# Patient Record
Sex: Male | Born: 1965
Health system: Southern US, Community
[De-identification: ages and names within clinical notes are randomized; demographics above are authoritative.]

## PROBLEM LIST (undated history)

## (undated) ENCOUNTER — Inpatient Hospital Stay: Admission: AD | Payer: BLUE CROSS/BLUE SHIELD | Source: Ambulatory Visit | Admitting: Surgery

## (undated) DIAGNOSIS — L97309 Non-pressure chronic ulcer of unspecified ankle with unspecified severity: Secondary | ICD-10-CM

## (undated) DIAGNOSIS — I8390 Asymptomatic varicose veins of unspecified lower extremity: Secondary | ICD-10-CM

## (undated) DIAGNOSIS — I82409 Acute embolism and thrombosis of unspecified deep veins of unspecified lower extremity: Secondary | ICD-10-CM

## (undated) DIAGNOSIS — G40409 Other generalized epilepsy and epileptic syndromes, not intractable, without status epilepticus: Secondary | ICD-10-CM

## (undated) HISTORY — DX: Asymptomatic varicose veins of unspecified lower extremity: I83.90

## (undated) HISTORY — PX: FRACTURE SURGERY: SHX138

---

## 2007-04-28 ENCOUNTER — Ambulatory Visit: Payer: Self-pay | Admitting: Vascular Surgery

## 2007-08-08 ENCOUNTER — Ambulatory Visit: Payer: Self-pay | Admitting: Vascular Surgery

## 2007-09-10 ENCOUNTER — Ambulatory Visit: Payer: Self-pay | Admitting: Vascular Surgery

## 2007-09-17 ENCOUNTER — Ambulatory Visit: Payer: Self-pay | Admitting: Vascular Surgery

## 2008-01-21 ENCOUNTER — Ambulatory Visit: Payer: Self-pay | Admitting: Vascular Surgery

## 2008-01-28 ENCOUNTER — Ambulatory Visit: Payer: Self-pay | Admitting: Vascular Surgery

## 2008-02-23 ENCOUNTER — Emergency Department (HOSPITAL_COMMUNITY): Admission: EM | Admit: 2008-02-23 | Discharge: 2008-02-23 | Payer: Self-pay | Admitting: Emergency Medicine

## 2008-03-12 ENCOUNTER — Ambulatory Visit: Payer: Self-pay | Admitting: Vascular Surgery

## 2008-11-05 ENCOUNTER — Ambulatory Visit: Payer: Self-pay | Admitting: Vascular Surgery

## 2008-11-12 ENCOUNTER — Ambulatory Visit: Payer: Self-pay | Admitting: Vascular Surgery

## 2008-11-17 ENCOUNTER — Ambulatory Visit: Payer: Self-pay | Admitting: Vascular Surgery

## 2008-11-26 ENCOUNTER — Ambulatory Visit: Payer: Self-pay | Admitting: Vascular Surgery

## 2008-12-03 ENCOUNTER — Ambulatory Visit: Payer: Self-pay | Admitting: Vascular Surgery

## 2008-12-15 ENCOUNTER — Ambulatory Visit: Payer: Self-pay | Admitting: Vascular Surgery

## 2008-12-22 ENCOUNTER — Ambulatory Visit: Payer: Self-pay | Admitting: Vascular Surgery

## 2008-12-31 ENCOUNTER — Ambulatory Visit: Payer: Self-pay | Admitting: Vascular Surgery

## 2009-01-07 ENCOUNTER — Ambulatory Visit: Payer: Self-pay | Admitting: Vascular Surgery

## 2009-02-04 ENCOUNTER — Ambulatory Visit: Payer: Self-pay | Admitting: Vascular Surgery

## 2009-10-19 ENCOUNTER — Ambulatory Visit: Payer: Self-pay | Admitting: Vascular Surgery

## 2009-12-30 ENCOUNTER — Ambulatory Visit: Payer: Self-pay | Admitting: Vascular Surgery

## 2010-01-05 ENCOUNTER — Ambulatory Visit: Payer: Self-pay | Admitting: Vascular Surgery

## 2010-01-12 ENCOUNTER — Ambulatory Visit: Payer: Self-pay | Admitting: Vascular Surgery

## 2010-01-19 ENCOUNTER — Ambulatory Visit: Payer: Self-pay | Admitting: Vascular Surgery

## 2010-01-25 ENCOUNTER — Ambulatory Visit: Payer: Self-pay | Admitting: Vascular Surgery

## 2010-10-25 ENCOUNTER — Ambulatory Visit: Payer: Self-pay | Admitting: Vascular Surgery

## 2010-11-21 ENCOUNTER — Ambulatory Visit: Payer: Self-pay | Admitting: Vascular Surgery

## 2011-05-08 NOTE — Assessment & Plan Note (Signed)
OFFICE VISIT   TREVAUGHN, SCHEAR  DOB:  04/19/1966                                       11/05/2008  XBJYN#:[id number]   Patient presents today for evaluation of his lower extremity  ulcerations.  He is status post staged bilateral laser ablation and stab  phlebectomy and greater saphenous vein tributary varicosities.  The  right leg was in 08/2007 and left leg in 12/2007.  He reports having  superficial skin irritations and ulceration last week and was seen by  Dr. Ivory Broad, who appropriately placed him in [facility] boot therapy and started  him on Keflex 500 mg t.i.d.   He has had the Unna boot on for 2 days now.  He has not had any drainage  from this.  He did undergo a hand-held duplex by me, and this shows  closure of the saphenous vein bilaterally.  He does have some tributary  varicosities.   I have recommended that we continue with the Unna boot treatment.  We  will see him again in 1 week for continued Unna boot, and once this is  completely healed, we will repeat his formal duplex bilaterally to  determined if there is an etiology that can be corrected for ongoing  venous hypertension.  He reports this is not similar to his prior open  severe ulcerations.  We will continue to follow him with Unna boot and  eventual ultrasound treatment.  He is traveling to [facility] next week,  and I feel that this is appropriate with the usual precautions of up  walking while on the airplane.   Larina Earthly, M.D.  Electronically Signed   TFE/MEDQ  D:  11/05/2008  T:  11/08/2008  Job:  [id number]   cc:   Dr. Nolon Nations

## 2011-05-08 NOTE — Assessment & Plan Note (Signed)
OFFICE VISIT   Patrick Ingram, Patrick Ingram  DOB:  1966-02-27                                       03/12/2008  ZOXWR#:[id number]   The patient presents today for followup of his bilateral laser ablation  and stab phlebectomy.  He had a recent fall in the snow several weeks  ago and had some abrasion over his pretibial area on the left, and also  his hand.  He does continue to have some swelling.  His duplex today  shows that he does have closure of his greater saphenous vein  bilaterally with some tributary varices in his thighs bilaterally.  He  will continue his graduated compression use and evaluation when  possible.  He will see [place] on a p.r.n. basis.   Larina Earthly, M.D.  Electronically Signed   TFE/MEDQ  D:  03/12/2008  T:  03/15/2008  Job:  [id number]

## 2011-05-08 NOTE — Assessment & Plan Note (Signed)
OFFICE VISIT   Patrick Ingram, Patrick Ingram  DOB:  [birth year]                                       02/04/2009  EAVWU#:[id number]   The patient presents today for follow-up of his bilateral venous  hypertension.  He is status post staged bilateral laser ablation for  saphenous vein incompetence.  He also has deep venous incompetence and  has recurrent episodes of venous ulcers.  He has had effective treatment  with Unna boots and has now, fortunately, completely healed all  ulcerations.  He is extremely compliant with his compression garments.  He will notify us should he develop any difficulty and understands the  importance for lifelong compression.   Larina Earthly, M.D.  Electronically Signed   TFE/MEDQ  D:  02/04/2009  T:  02/08/2009  Job:  [id number]

## 2011-05-08 NOTE — Assessment & Plan Note (Signed)
OFFICE VISIT   Patrick Ingram, Patrick Ingram  DOB:  1966/10/12                                       08/08/2007  ZOXWR#:[id number]   Patrick Ingram presents today for continued followup of his severe venous  insufficiency bilaterally, this is more significant on the right than on  the left.  He reports that he has had no significant relief with the  stockings.  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  His leg pain makes this difficult with prolonged standing and  leg swelling.  He has had prior venous ulcers, these are currently  healed and he reports significant pain with these when they occur as  well.  He also has had a recent episode of superficial thrombophlebitis  which appears to be resolving without complication in his right medial  distal calf.  We again discussed options.  He has clearly failed  conservative therapy for his venous hypertension.  He has marked  saphenous and tributary varicosities.  I have recommended that we  proceed with right leg laser ablation and stab phlebectomy.  He does  have some low to moderate deep insufficiency as well and I explained  that this should markedly improve but he may continue to have some  venous hypertension long term.  He understands this and wishes to  proceed with laser ablation and stab phlebectomy of his right leg.  Once  he has recovered from this we will discuss need for left leg similar  treatment.   Larina Earthly, M.D.  Electronically Signed   TFE/MEDQ  D:  08/08/2007  T:  08/11/2007  Job:  [id number]

## 2011-05-08 NOTE — Assessment & Plan Note (Signed)
OFFICE VISIT   Patrick Ingram, Patrick Ingram  DOB:  1966-09-03                                       01/07/2009  EAVWU#:[id number]   The patient presents today for continued followup of his lower extremity  venous pathology.  He is status post bilateral laser ablations of the  saphenous vein.  He does have known deep venous incompetence as well.  He had developed recurrent ulceration on his left ankle and medial  malleolus area.  This has continued to heal with weekly Unna boot  therapy.  He has no evidence of ulceration today and will be switched to  compression garments.  He did have a repeat duplex today, and this shows  closure of the saphenous vein.  He will see me again in 1 month taken to  assure complete healing.   Larina Earthly, M.D.  Electronically Signed   TFE/MEDQ  D:  01/07/2009  T:  01/10/2009  Job:  [id number]

## 2011-05-08 NOTE — Assessment & Plan Note (Signed)
OFFICE VISIT   Patrick Ingram, Patrick Ingram  DOB:  Apr 29, 1966                                       01/25/2010  AOZHY#:[id number]   The patient presents today for followup of his venous hypertension.  He  had had recurrent ulceration in his left ankle.    DICTATION ENDS AT THIS POINT     Larina Earthly, M.D.  Electronically Signed   TFE/MEDQ  D:  01/25/2010  T:  01/26/2010  Job:  [id number]

## 2011-05-08 NOTE — Procedures (Signed)
DUPLEX DEEP VENOUS EXAM - LOWER EXTREMITY   INDICATION:  Follow up left greater saphenous vein ablation with  nonhealing ulcer of the left ankle.   HISTORY:  Edema:  Yes  Trauma/Surgery:  Yes  Pain:  Yes  PE:  No  Previous DVT:  No   DUPLEX EXAM:                CFV   SFV   PopV  PTV    GSV                R  L  R  L  R  L  R   L  R  L  Thrombosis    0  0     0     0      0  Spontaneous   +  0     0     0      0  Phasic        +  0     0     0      0  Augmentation  +  0     0     0      0  Compressible  +  0     0     0      0  Competent     +  0     0     0      0   Legend:  + - yes  o - no  p - partial  D - decreased   IMPRESSION:  1. No evidence of deep vein thrombosis noted in the left leg.  2. The left greater saphenous vein appears ablated from proximal thigh      to below-knee level.  3. Reflux noted in the deep system and at the saphenofemoral junction.    _____________________________  Larina Earthly, M.D.   MG/MEDQ  D:  01/25/2010  T:  01/26/2010  Job:  [id number]

## 2011-05-08 NOTE — Assessment & Plan Note (Signed)
OFFICE VISIT   Ingram, MOSSA  DOB:  06-13-1966                                       12/30/2009  EAVWU#:[id number]   The patient was in today with a new ulceration over his left lateral  ankle just below the lateral malleolus.  He had had complete healing of  his bilateral severe venous stasis ulceration in the past and undergone  bilateral laser ablation of a saphenous vein.  This was done in 2008.  He does have known valvular insufficiency in deep system as well.  He  has no new major medical difficulties and is otherwise in excellent  health.   He continues to be a nonsmoker and does not drink alcohol.   Review of systems as in his chart and otherwise negative.  He did have a  history of seizures in 1991.  He is married, is a Solicitor.   PHYSICAL EXAMINATION:  He does have 2+ dorsalis pedis pulses  bilaterally.  He does not have any ulcerations on the right.  On the  left leg, he does have an open ulcer over the medial malleolus with a  rather shaggy base.  Blood pressure today is 126/70, pulse 90,  respirations 18.  He does have typical changes on his skin of erythema  deposit from his knees distally.   He underwent handheld duplex by myself. This does show successful  closure of his great saphenous vein from his knee proximally.  He does  have patent vein, since this was not treated, from his ankle to his  knee.  He does have some tributaries varicosities as well.   I discussed this with the patient.  I have recommend that we get him  refitted for an Unna boot today which was done and we will see him again  in 1 month after weekly Unna boot treatment.  He will also undergo  formal duplex at the time of his next evaluation with myself in a month.     Larina Earthly, M.D.  Electronically Signed   TFE/MEDQ  D:  12/30/2009  T:  01/02/2010  Job:  [id number]

## 2011-05-08 NOTE — Assessment & Plan Note (Signed)
OFFICE VISIT   ELSTER, CORBELLO  DOB:  08/12/1966                                       01/28/2008  LKGMW#:[id number]   Patient presents today for followup of his laser ablation of left  greater saphenous vein and stab phlebectomy.  He did have some bleeding  before he left our office and had to have rewrapping at the time of the  procedure.  He has had no further bleeding and does have the usual  amount of erythema and discomfort following the procedure.  He does have  mild bruising and the typical amount of erythema and discomfort.   He underwent limited venous duplex in our office today, and this reveals  closure of a saphenous vein throughout the left thigh.  He has wide  patency of the common femoral vein with occlusion of the vein just up to  the level of the saphenofemoral junction.   I am quite pleased with the patient's initial results and will see him  again in six weeks for final followup.   Larina Earthly, M.D.  Electronically Signed   TFE/MEDQ  D:  01/28/2008  T:  01/30/2008  Job:  [id number]

## 2011-05-08 NOTE — Procedures (Signed)
DUPLEX DEEP VENOUS EXAM - LOWER EXTREMITY   INDICATION:  Ulceration.   HISTORY:  Edema:  No.  Trauma/Surgery:  Bilateral GSV ablation.  Pain:  No.  PE:  No.  Previous DVT:  No.  Anticoagulants:  No.  Other:   DUPLEX EXAM:                CFV   SFV   PopV  PTV    GSV                R  L  R  L  R  L  R   L  R  L  Thrombosis    o  o  o  o  o  o  o   +  +  +  Spontaneous   +  +  +  +  +  +  +   +  o  o  Phasic        +  +  +  +  +  +  +   +  o  o  Augmentation  +  +  +  +  +  +  +   +  o  o  Compressible  +  +  +  +  +  +  +   +  o  o  Competent     +  o  o  o  o  +  o   +  o  o   Legend:  + - yes  o - no  p - partial  D - decreased   IMPRESSION:  1. No evidence of deep vein thrombosis in both lower extremities.  2. Right great saphenous vein appears ablated.  3. A chanel noted in proximal left great saphenous vein with reflex.  4. Perforator noted on the right mid calf.         _____________________________  Larina Earthly, M.D.   AC/MEDQ  D:  01/07/2009  T:  01/07/2009  Job:  [id number]

## 2011-05-08 NOTE — Assessment & Plan Note (Signed)
OFFICE VISIT   JACKSTON, OAXACA  DOB:  December 10, 1966                                       12/03/2008  EAVWU#:[id number]   The patient presents today for continued follow-up of his bilateral  severe venous hypertension.  He has been wearing Unna boots weekly for  approximately 1 month.  He does have complete healing of the ulceration  on his right ankle, he does have mild superficial ulceration on his left  ankle.  He will continue to have an Unna boot on his left leg for 2  additional weeks.  We will see him again in 1 month with formal  bilateral duplex venous ultrasound.  His pre-laser ablation procedure  ultrasound revealed bilateral deep venous reflux and bilateral  perforator as well.  I explained to him that the most easily treatable  portion has been taken care of with ablation of his saphenous vein which  had gross reflux.  He understands that he continues to have venous  hypertension based on deep venous reflux and probably with perforator  reflux as well.  He understands that he will have to have lifelong  graduated compression garments for treatment of his venous hypertension.  We will discuss further options after his formal duplex in 1 month.   Larina Earthly, M.D.  Electronically Signed   TFE/MEDQ  D:  12/03/2008  T:  12/06/2008  Job:  [id number]

## 2011-05-08 NOTE — Assessment & Plan Note (Signed)
OFFICE VISIT   [name], [name]  DOB:  07-18-1966                                       09/17/2007  VWUJW#:[id number]   HISTORY:  Patrick Ingram presents today for evaluation of his right  greater saphenous vein laser ablation and tributary stab phlebectomies  one week ago.  He has had an excellent initial result with minimal  discomfort.  His incisions are all healing nicely as the Steri-Strips  are removed.  He has minimal erythema over the vein.  He underwent a  duplex today which shows closure of his saphenous vein and a widely  patent common femoral vein at the saphenofemoral junction.  He has  continued to have difficulty with his left leg and we are scheduling  similar treatment for his left leg at his convenience.   Larina Earthly, M.D.  Electronically Signed   TFE/MEDQ  D:  09/17/2007  T:  09/18/2007  Job:  [id number]

## 2011-05-08 NOTE — Assessment & Plan Note (Signed)
OFFICE VISIT   Patrick Ingram, Patrick Ingram  DOB:  01-Feb-1966                                       11/05/2008  ZOXWR#:[id number]   The patient presents today for evaluation of bilateral lower extremity  ulceration.  He is well-known to me from prior bilateral laser ablation  of saphenous vein and stab phlebectomy of tributary varicosities, right  leg was in September 2008 and left leg in January 2009.  He had done  well since this but has recently had superficial skin breakdown in both  lower extremities   Larina Earthly, M.D.  Electronically Signed   TFE/MEDQ  D:  11/05/2008  T:  11/08/2008  Job:  [id number]   cc:   Barnabas Lister, Dr.

## 2011-05-08 NOTE — Assessment & Plan Note (Signed)
OFFICE VISIT   LEYLAND, KENNA  DOB:  Dec 01, 1966                                       01/25/2010  ZOXWR#:[id number]   The patient presents today for continued followup of his venous  hypertension.  He is status post laser ablation of his bilateral  saphenous veins in 2008.  He has had a recurrent ulceration over his  left lateral ankle.  He had been placed in an Radio broadcast assistant and has been  wearing an Radio broadcast assistant with a weekly change over the past several weeks.  I am seeing him today and this fortunately has healed.   He underwent formal duplex with [place] today and I have reviewed this with  him as well.  This does show he does have more ablation closure of his  great saphenous vein, does have significant reflux in his deep system as  known.  I again stressed to him the critical importance of continued  elevation and compression when possible.  He has been doing this very  effectively.  He will see [place] again on an as-needed basis and will notify  us immediately should he develop any recurrent ulceration.     Larina Earthly, M.D.  Electronically Signed   TFE/MEDQ  D:  01/25/2010  T:  01/26/2010  Job:  [id number]

## 2011-05-08 NOTE — Assessment & Plan Note (Signed)
OFFICE VISIT   Patrick Ingram, Patrick Ingram  DOB:  24-May-1966                                       11/21/2010  ZOXWR#:[id number]   The patient presents today for continued follow-up of venous  hypertension bilaterally.  He is well-known to me from prior staged  bilateral laser ablation in 2008.  He has known deep venous incompetence  as well.  He reports that over the past 3 to 4 weeks, he has had  recurrent ulcerations.  He has a small area less than 0.5  cm over the  lateral aspect of his left ankle and a 1 cm medial to his right arch.  There is no surrounding erythema.  I re-imaged his saphenous veins with  SonoSite bilaterally and this shows ablation bilaterally of the  saphenous vein.  He  does have small tributary varicosities and does  have as mentioned known deep venous reflux.  I explained to this patient  this is related to his venous hypertension due to his incorrectable  venous hypertension from his deep venous reflux.  He will continue his  use of compression garments.  We have given him a prescription for  Silvadene and instructed him on the use of this.  We will see him again  on a p.r.n. basis.     Larina Earthly, M.D.  Electronically Signed   TFE/MEDQ  D:  11/21/2010  T:  11/22/2010  Job:  [id number]

## 2011-05-14 DIAGNOSIS — G40909 Epilepsy, unspecified, not intractable, without status epilepticus: Secondary | ICD-10-CM | POA: Insufficient documentation

## 2011-11-12 ENCOUNTER — Other Ambulatory Visit: Payer: Self-pay

## 2011-11-12 DIAGNOSIS — L97909 Non-pressure chronic ulcer of unspecified part of unspecified lower leg with unspecified severity: Secondary | ICD-10-CM

## 2011-11-12 MED ORDER — SILVER SULFADIAZINE 1 % EX CREA: TOPICAL_CREAM | CUTANEOUS | Status: DC

## 2011-11-12 NOTE — Telephone Encounter (Signed)
Pt. Called to request refill on Silvadene cream.  States has area that hasn't opened-up on leg, but very close to potentially opening.  States continues to use compression stockings.  Discussed w/ S. Rankin, RN; pt. Has hx of venous htn, and bilateral vein laser ablations.  Will refill Silvadene cream.  Pt. Advised to call for appt. If develops ulceration on leg.  Verb. Understanding.

## 2012-03-04 ENCOUNTER — Other Ambulatory Visit: Payer: Self-pay

## 2012-03-04 DIAGNOSIS — I83009 Varicose veins of unspecified lower extremity with ulcer of unspecified site: Secondary | ICD-10-CM

## 2012-03-04 MED ORDER — SILVER SULFADIAZINE 1 % EX CREA: TOPICAL_CREAM | CUTANEOUS | Status: DC

## 2012-03-11 ENCOUNTER — Other Ambulatory Visit: Payer: Self-pay | Admitting: *Deleted

## 2012-03-11 DIAGNOSIS — L97929 Non-pressure chronic ulcer of unspecified part of left lower leg with unspecified severity: Secondary | ICD-10-CM

## 2012-03-17 ENCOUNTER — Encounter: Payer: Self-pay | Admitting: Vascular Surgery

## 2012-03-18 ENCOUNTER — Encounter (INDEPENDENT_AMBULATORY_CARE_PROVIDER_SITE_OTHER): Payer: BC Managed Care – PPO | Admitting: *Deleted

## 2012-03-18 ENCOUNTER — Ambulatory Visit (INDEPENDENT_AMBULATORY_CARE_PROVIDER_SITE_OTHER): Payer: BC Managed Care – PPO | Admitting: Vascular Surgery

## 2012-03-18 ENCOUNTER — Encounter: Payer: Self-pay | Admitting: Vascular Surgery

## 2012-03-18 VITALS — BP 121/79 | HR 81 | Temp 98.3°F | Ht 74.0 in | Wt 238.0 lb

## 2012-03-18 DIAGNOSIS — L97919 Non-pressure chronic ulcer of unspecified part of right lower leg with unspecified severity: Secondary | ICD-10-CM

## 2012-03-18 DIAGNOSIS — I83893 Varicose veins of bilateral lower extremities with other complications: Secondary | ICD-10-CM

## 2012-03-18 NOTE — Progress Notes (Signed)
The patient is well-known to me from prior staged bilateral great saphenous vein ablation and 2008. He had severe venous hypertension that time and has had significant improvement. He does have known chronic deep venous reflux and does have a persistent to venous hypertension related to this. He reports today with a new episode of small ulcer on the medial aspect of his ankle. He reports is been present for several weeks and this approximately 2-3 mm in size. Is not having any significant pain associated with this. He has been extremely compliant regarding his compression. He had had significantly large ulcers in the past on the medial and lateral aspect and therefore wishes to see [place] to does not treatment required.  Past Medical History  Diagnosis Date  . Varicose veins   . Ulcer   . Seizure disorder     LAST SEIZURE 1993    History  Substance Use Topics  . Smoking status: Never Smoker   . Smokeless tobacco: Never Used  . Alcohol Use: No    Family History  Problem Relation Age of Onset  . Heart disease Father   . Diabetes Paternal Grandmother     No Known Allergies  Current outpatient prescriptions:phenytoin (DILANTIN) 100 MG ER capsule, Take 100 mg by mouth 3 (three) times daily., Disp: , Rfl: ;  silver sulfADIAZINE (SILVADENE) 1 % cream, Apply to affected area daily, Disp: 50 g, Rfl: 1  BP 121/79  Pulse 81  Temp 98.3 F (36.8 C)  Ht 6\' 2"  (1.88 m)  Wt 238 lb (107.956 kg)  BMI 30.56 kg/m2  Body mass index is 30.56 kg/(m^2).       Review of systems no change  Physical exam: Well-developed well-nourished gentleman in no acute distress he does have 2+ dorsalis pedis pulses bilaterally. Does have hemosiderin deposits but no focal ulcers etc. aside from a small punctate area in the medial aspect of his left ankle.  Venous duplex shows no evidence of DVT bilaterally the bilateral great saphenous vein and not visualized the 2 prior ablation areas reflux noted throughout the  deep system is bilaterally.  Impression and plan: Venous hypertension bilaterally related to deep venous reflux. The patient is an extremely compliant with compression. He was refitted today with additional new stockings since his other ones were becoming worn. He understands the importance of elevation and possible and compression garments. He will continue with Silvadene as she has been doing over the small open ulceration on notify should he have progression

## 2012-03-25 NOTE — Procedures (Unsigned)
DUPLEX DEEP VENOUS EXAM - LOWER EXTREMITY  INDICATION:  Varicose veins.  HISTORY:  Edema:  No. Trauma/Surgery:  Bilateral greater saphenous vein laser ablations in 2008. Pain:  No. PE:  No. Previous DVT:  No. Anticoagulants: Other:  DUPLEX EXAM:               CFV   SFV   PopV  PTV    GSV               R  L  R  L  R  L  R   L  R  L Thrombosis    o  o  o  o  o  o  o   o Spontaneous   +  +  +  +  +  +  +   + Phasic        +  +  +  +  +  +  +   + Augmentation  +  +  +  +  +  +  +   + Compressible  +  +  +  +  +  +  +   + Competent     0  0  0  0  0  0  +   0  Legend:  + - yes  o - no  p - partial  D - decreased   IMPRESSION: 1. No evidence of deep vein thrombosis noted in the bilateral lower     extremities. 2. The bilateral great saphenous veins were not adequately visualized     due to history of laser ablation. 3. Reflux of >57milliseconds noted throughout the bilateral deep     venous systems and in the left medial accessory saphenous branch     which measures 0.47 cm.       _____________________________ Larina Earthly, M.D.  CH/MEDQ  D:  03/20/2012  T:  03/20/2012  Job:  [id number]

## 2012-11-04 ENCOUNTER — Other Ambulatory Visit: Payer: Self-pay | Admitting: *Deleted

## 2012-11-04 DIAGNOSIS — L97909 Non-pressure chronic ulcer of unspecified part of unspecified lower leg with unspecified severity: Secondary | ICD-10-CM

## 2012-11-04 MED ORDER — SILVER SULFADIAZINE 1 % EX CREA: TOPICAL_CREAM | CUTANEOUS | Status: AC

## 2013-03-12 ENCOUNTER — Telehealth: Payer: Self-pay

## 2013-03-12 DIAGNOSIS — I872 Venous insufficiency (chronic) (peripheral): Secondary | ICD-10-CM

## 2013-03-12 DIAGNOSIS — I87303 Chronic venous hypertension (idiopathic) without complications of bilateral lower extremity: Secondary | ICD-10-CM

## 2013-03-12 NOTE — Telephone Encounter (Signed)
Pt. called to request appt. for evaluation of tenderness inner left ankle with a scab intact. States the scab will not heal.  Denies any inflammation or swelling.  Currently using Silvadine cream to affected area, and wearing compression stockings. Advised will schedule appt. with Dr. Arbie Cookey.

## 2013-03-16 ENCOUNTER — Encounter: Payer: Self-pay | Admitting: Vascular Surgery

## 2013-03-17 ENCOUNTER — Encounter: Payer: Self-pay | Admitting: Vascular Surgery

## 2013-03-17 ENCOUNTER — Encounter (INDEPENDENT_AMBULATORY_CARE_PROVIDER_SITE_OTHER): Payer: BC Managed Care – PPO | Admitting: *Deleted

## 2013-03-17 ENCOUNTER — Ambulatory Visit (INDEPENDENT_AMBULATORY_CARE_PROVIDER_SITE_OTHER): Payer: BC Managed Care – PPO | Admitting: Vascular Surgery

## 2013-03-17 ENCOUNTER — Other Ambulatory Visit: Payer: Self-pay | Admitting: *Deleted

## 2013-03-17 VITALS — BP 121/72 | HR 65 | Resp 18 | Ht 74.0 in | Wt 237.2 lb

## 2013-03-17 DIAGNOSIS — I83221 Varicose veins of left lower extremity with both ulcer of thigh and inflammation: Secondary | ICD-10-CM

## 2013-03-17 DIAGNOSIS — I83219 Varicose veins of right lower extremity with both ulcer of unspecified site and inflammation: Secondary | ICD-10-CM

## 2013-03-17 DIAGNOSIS — I87303 Chronic venous hypertension (idiopathic) without complications of bilateral lower extremity: Secondary | ICD-10-CM

## 2013-03-17 DIAGNOSIS — M79609 Pain in unspecified limb: Secondary | ICD-10-CM

## 2013-03-17 DIAGNOSIS — I83893 Varicose veins of bilateral lower extremities with other complications: Secondary | ICD-10-CM

## 2013-03-17 NOTE — Progress Notes (Signed)
Patient does today for continued evaluation of his severe bilateral venous hypertension. He R. office from initial treatment in 2008 where he underwent staged bilateral laser ablation of his great saphenous vein. He does have known severe venous incompetence in his deep system as well. The past he has had severe bilateral venous ulcers. These have healed and over the last several weeks has had a recurrence in his left ankle near the medial malleolus. Does have some discomfort related to this with prolonged standing. He has no evidence of infection no fevers. He is compliant with his compression garments.  Past Medical History  Diagnosis Date  . Varicose veins   . Ulcer   . Seizure disorder     LAST SEIZURE 1993    History  Substance Use Topics  . Smoking status: Never Smoker   . Smokeless tobacco: Never Used  . Alcohol Use: No    Family History  Problem Relation Age of Onset  . Heart disease Father   . Diabetes Paternal Grandmother     No Known Allergies  Current outpatient prescriptions:phenytoin (DILANTIN) 100 MG ER capsule, Take 100 mg by mouth 3 (three) times daily., Disp: , Rfl: ;  silver sulfADIAZINE (SILVADENE) 1 % cream, Apply to affected area daily, Disp: 50 g, Rfl: 1  BP 121/72  Pulse 65  Resp 18  Ht 6\' 2"  (1.88 m)  Wt 237 lb 3.2 oz (107.593 kg)  BMI 30.44 kg/m2  Body mass index is 30.44 kg/(m^2).       Physical exam: Well-developed well-nourished white male no acute distress. Status 2+ dorsalis pedis pulses bilaterally He does have marked changes of venous stasis disease in both lower extremities with marked hemosiderin deposit circumferentially around his distal calf and ankle. He does have a small proximal to 3 mm punctate ulcer below the level of his medial malleolus on the left. He does have scattered tributary varicosities in his thigh.  Venous duplex today shows reflux throughout his deep system on the left. There appears to be some old chronic DVT in the  femoral vein and popliteal vein on the left. There is a lateral accessory branch near the saphenofemoral junction. The great saphenous vein itself is ablated and has remained close since his initial procedure. The accessory branch does give off several varicosity branches. There are several perforators ranging in size from 0.28 2.35 mm.  I imaged the areas myself with SonoSite ultrasound. The saphenous vein is clearly ablated.  Impression and plan bilateral severe venous hypertension related to deep venous reflux. I discussed the findings of the left leg venous duplex with the patient. I do not feel that treatment of the anterior chest for branch or these perforators would give any significant relief from his venous hypertension since he has gross reflux in his deep system. He was reassured these studies. He will continue with Silvadene dressing and compression. He has purchased a new compression since that his older compression stockings were not giving a great deal of support. He will see [place] again on an as-needed basis

## 2013-06-05 ENCOUNTER — Telehealth: Payer: Self-pay

## 2013-06-05 NOTE — Telephone Encounter (Signed)
Pt. requests appt. with Dr. Arbie Cookey for c/o a new ulcer that has developed on the "outer left ankle".   States the last time he was evaluated, he had an ulceration on the inner left ankle.  States he is using compression and elevation, but this is a new open area, despite using the recommended treatment.  Denies redness/inflammation, surrounding ulcer. Denies swelling in left lower extremity.  States there is "slight color to the drainage from the ulcer".  Using Silvadine cream as prescribed.  Requests appt. with Dr. Arbie Cookey.  Advised will have appt. scheduled for evaluation.

## 2013-06-08 ENCOUNTER — Encounter: Payer: Self-pay | Admitting: Vascular Surgery

## 2013-06-09 ENCOUNTER — Ambulatory Visit (INDEPENDENT_AMBULATORY_CARE_PROVIDER_SITE_OTHER): Payer: BC Managed Care – PPO | Admitting: Vascular Surgery

## 2013-06-09 ENCOUNTER — Encounter: Payer: Self-pay | Admitting: Vascular Surgery

## 2013-06-09 ENCOUNTER — Other Ambulatory Visit: Payer: Self-pay | Admitting: *Deleted

## 2013-06-09 VITALS — BP 114/72 | HR 83 | Resp 16 | Ht 74.0 in | Wt 243.0 lb

## 2013-06-09 DIAGNOSIS — R52 Pain, unspecified: Secondary | ICD-10-CM | POA: Insufficient documentation

## 2013-06-09 DIAGNOSIS — I83009 Varicose veins of unspecified lower extremity with ulcer of unspecified site: Secondary | ICD-10-CM | POA: Insufficient documentation

## 2013-06-09 DIAGNOSIS — I872 Venous insufficiency (chronic) (peripheral): Secondary | ICD-10-CM

## 2013-06-09 DIAGNOSIS — R6889 Other general symptoms and signs: Secondary | ICD-10-CM

## 2013-06-09 DIAGNOSIS — I83029 Varicose veins of left lower extremity with ulcer of unspecified site: Secondary | ICD-10-CM

## 2013-06-09 DIAGNOSIS — IMO0001 Reserved for inherently not codable concepts without codable children: Secondary | ICD-10-CM

## 2013-06-09 NOTE — Progress Notes (Signed)
Problems with Activities of Daily Living Secondary to Leg Pain  1. 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  This is very difficult for him due to leg pain.  2. [name] is unable to do yard work and house work due to leg pain and ankle ulcer.  3. [name] states he is unable to exercise due to leg pain and ulcer.   Failure of  Conservative Therapy:  1. Worn 20-30 mm Hg thigh high compression hose >3 months with no relief of symptoms.  2. Frequently elevates legs-no relief of symptoms  3. Taken Ibuprofen 600 Mg TID with no relief of symptoms.  [name] presents today for continued evaluation of his severe chronic venous hypertension. He has extremely diligent regarding his elevation and compression garment. Despite this he has developed recurrent ulceration allergies a lateral aspect of his left ankle. The area of his medial ankles this has slowly gone on to near healing. He does have significant tributary varicosities throughout his medial calf and thigh.  I reimaged his thigh with SonoSite ultrasound and this is consistent with his formal duplex from 3 months ago. Does have a large anterior saphenous branch over his anterior thigh that extends into these large varicosities on his medial thigh.  Impression and plan persistent venous hypertension. He does have known deep venous reflux. He did have successful closure of the saphenous vein and this is confirmed with his ultrasound is also seen today on his SonoSite by myself. I feel that he is getting a significant contribution of venous hypertension from the large anterior saphenous branch and also tributary varicosities to his thigh and calf. I have recommended laser ablation of his anterior saphenous branch and stab phlebectomy of multiple tributary varicosities below this. We will proceed at his convenience for improvement in his venous hypertension he will continue his local wound care to his ankle  ulcer elevation and compression

## 2013-06-17 ENCOUNTER — Other Ambulatory Visit: Payer: Self-pay | Admitting: *Deleted

## 2013-06-17 DIAGNOSIS — M79609 Pain in unspecified limb: Secondary | ICD-10-CM

## 2013-06-17 DIAGNOSIS — L97929 Non-pressure chronic ulcer of unspecified part of left lower leg with unspecified severity: Secondary | ICD-10-CM

## 2013-06-24 ENCOUNTER — Encounter: Payer: Self-pay | Admitting: Vascular Surgery

## 2013-06-25 ENCOUNTER — Ambulatory Visit (INDEPENDENT_AMBULATORY_CARE_PROVIDER_SITE_OTHER): Payer: BC Managed Care – PPO | Admitting: Vascular Surgery

## 2013-06-25 ENCOUNTER — Encounter: Payer: Self-pay | Admitting: Vascular Surgery

## 2013-06-25 VITALS — BP 119/76 | HR 96 | Resp 18 | Ht 74.0 in | Wt 243.0 lb

## 2013-06-25 DIAGNOSIS — L97909 Non-pressure chronic ulcer of unspecified part of unspecified lower leg with unspecified severity: Secondary | ICD-10-CM

## 2013-06-25 DIAGNOSIS — I83009 Varicose veins of unspecified lower extremity with ulcer of unspecified site: Secondary | ICD-10-CM | POA: Insufficient documentation

## 2013-06-25 DIAGNOSIS — L97929 Non-pressure chronic ulcer of unspecified part of left lower leg with unspecified severity: Secondary | ICD-10-CM

## 2013-06-25 DIAGNOSIS — I83893 Varicose veins of bilateral lower extremities with other complications: Secondary | ICD-10-CM

## 2013-06-25 HISTORY — PX: ENDOVENOUS ABLATION SAPHENOUS VEIN W/ LASER: SUR449

## 2013-06-25 NOTE — Progress Notes (Signed)
Laser Ablation Procedure      Date: 06/25/2013    Patrick Ingram DOB:March 07, 1966  Consent signed: Yes  Surgeon:T.F. Keylor Rands  Procedure: Laser Ablation: left Greater Saphenous Vein ( Anterior branch of greater saphenous vein treated)  BP 119/76  Pulse 96  Resp 18  Ht 6\' 2"  (1.88 m)  Wt 243 lb (110.224 kg)  BMI 31.19 kg/m2  Start time: 8:55AM   End time: 10:15AM  Tumescent Anesthesia: 475 cc 0.9% NaCl with 50 cc Lidocaine HCL with 1% Epi and 15 cc 8.4% NaHCO3  Local Anesthesia: 2 cc Lidocaine HCL and NaHCO3 (ratio 2:1)  Continuous Mode: 15Watts [employer] 877 Joules Total Time0:58     Stab Phlebectomy: 10-20 Sites: Thigh and Calf  Left leg  Patient tolerated procedure well: Yes  Notes: Patrick Ingram experienced bleeding post endovenous laser ablation and stab phlebectomy in his car in the parking lot. He returned into [facility]. ABD pad over stab phlebectomy site left calf was 50% saturated with blood.Put Patrick Ingram in trendelenburg position, removed compression dressing. Small amount of oozing noted. Direct compression applied and oozing stopped. Compression dressing reapplied to left leg.  Patrick Ingram transported to car via wheelchair.     Description of Procedure:  After marking the course of the saphenous vein and the secondary varicosities in the standing position, the patient was placed on the operating table in the supine position, and the left leg was prepped and draped in sterile fashion. Local anesthetic was administered, and under ultrasound guidance the saphenous vein was accessed with a micro needle and guide wire; then the micro puncture sheath was placed. A guide wire was inserted to the saphenofemoral junction, followed by a 5 french sheath.  The position of the sheath and then the laser fiber below the junction was confirmed using the ultrasound and visualization of the aiming beam.  Tumescent anesthesia was administered along the course of the saphenous vein using ultrasound  guidance. Protective laser glasses were placed on the patient, and the laser was fired at at 15 watt continuous mode.  For a total of 877 joules.  A steri strip was applied to the puncture site.  The patient was then put into Trendelenburg position.  Local anesthetic was utilized overlying the marked varicosities.  Greater than 10-20 stab wounds were made using the tip of an 11 blade; and using the vein hook,  The phlebectomies were performed using a hemostat to avulse these varicosities.  Adequate hemostasis was achieved, and steri strips were applied to the stab wound.      ABD pads and thigh high compression stockings were applied.  Ace wrap bandages were applied over the phlebectomy sites and at the top of the saphenofemoral junction.  Blood loss was less than 15 cc.  The patient ambulated out of the operating room having tolerated the procedure well.

## 2013-06-29 ENCOUNTER — Encounter: Payer: Self-pay | Admitting: Vascular Surgery

## 2013-06-30 ENCOUNTER — Telehealth: Payer: Self-pay | Admitting: *Deleted

## 2013-06-30 NOTE — Telephone Encounter (Signed)
06/30/2013  Time: 11:48 AM   Patient Name: Patrick Ingram  Patient of: T.F. Early  Procedure:Laser Ablation left anterior branch of greater saphenous vein and stab phlebectomy 10-20 incisions left leg 06-25-2013   Reached patient at home and checked  His status  Yes    Comments/Actions Taken: Mr. Fritze states no problems with bleeding, oozing, swelling or pain.  Reviewed post procedural instructions and reminded him of post laser ablation duplex and follow up appointment with Dr. Arbie Cookey on 07-02-2013.      @SIGNATURE @

## 2013-07-01 ENCOUNTER — Encounter: Payer: Self-pay | Admitting: Vascular Surgery

## 2013-07-02 ENCOUNTER — Encounter: Payer: Self-pay | Admitting: Vascular Surgery

## 2013-07-02 ENCOUNTER — Encounter (INDEPENDENT_AMBULATORY_CARE_PROVIDER_SITE_OTHER): Payer: BC Managed Care – PPO | Admitting: *Deleted

## 2013-07-02 ENCOUNTER — Ambulatory Visit (INDEPENDENT_AMBULATORY_CARE_PROVIDER_SITE_OTHER): Payer: BC Managed Care – PPO | Admitting: Vascular Surgery

## 2013-07-02 VITALS — BP 122/78 | HR 73 | Resp 18 | Ht 74.0 in | Wt 243.0 lb

## 2013-07-02 DIAGNOSIS — I83009 Varicose veins of unspecified lower extremity with ulcer of unspecified site: Secondary | ICD-10-CM

## 2013-07-02 DIAGNOSIS — L97909 Non-pressure chronic ulcer of unspecified part of unspecified lower leg with unspecified severity: Secondary | ICD-10-CM

## 2013-07-02 DIAGNOSIS — L97929 Non-pressure chronic ulcer of unspecified part of left lower leg with unspecified severity: Secondary | ICD-10-CM

## 2013-07-02 DIAGNOSIS — I83029 Varicose veins of left lower extremity with ulcer of unspecified site: Secondary | ICD-10-CM

## 2013-07-02 DIAGNOSIS — M79609 Pain in unspecified limb: Secondary | ICD-10-CM

## 2013-07-02 NOTE — Progress Notes (Signed)
The patient has today one week status post laser ablation of anterior branch of left great saphenous vein and phlebectomy of multiple tributary varicosities in his thigh and calf. He has the usual amount of mild bruising and soreness. He has been compliant with his compression garment.  Duplex today reveals ablation of his anterior branch of his great saphenous vein throughout the thigh up to approximately 0.6 cm from the saphenofemoral junction. There is no evidence of DVT. Am quite pleased with his progress. He will continue use of this compression garments to his deep venous reflux. We will see him again on an as-needed basis

## 2013-09-23 HISTORY — PX: ENDOVENOUS ABLATION SAPHENOUS VEIN W/ LASER: SUR449

## 2014-07-01 ENCOUNTER — Other Ambulatory Visit: Payer: Self-pay | Admitting: *Deleted

## 2014-07-01 DIAGNOSIS — L97929 Non-pressure chronic ulcer of unspecified part of left lower leg with unspecified severity: Principal | ICD-10-CM

## 2014-07-01 DIAGNOSIS — I83029 Varicose veins of left lower extremity with ulcer of unspecified site: Secondary | ICD-10-CM

## 2014-07-01 MED ORDER — SILVER SULFADIAZINE 1 % EX CREA
1.0000 | TOPICAL_CREAM | Freq: Every day | CUTANEOUS | Status: DC
Start: 2014-07-01 — End: 2017-11-09

## 2014-08-23 ENCOUNTER — Telehealth: Payer: Self-pay | Admitting: *Deleted

## 2014-08-23 ENCOUNTER — Encounter: Payer: Self-pay | Admitting: Vascular Surgery

## 2014-08-23 NOTE — Telephone Encounter (Signed)
Patient called today to report that he has a new ulcer on his right ankle. Patient has had numerous ulcers and vein procedures in the past. Will put him on Dr. Bosie Helper schedule for tomorrow.

## 2014-08-24 ENCOUNTER — Ambulatory Visit (INDEPENDENT_AMBULATORY_CARE_PROVIDER_SITE_OTHER): Payer: BC Managed Care – PPO | Admitting: Vascular Surgery

## 2014-08-24 ENCOUNTER — Encounter: Payer: Self-pay | Admitting: Vascular Surgery

## 2014-08-24 VITALS — BP 115/76 | HR 88 | Resp 18 | Ht 74.0 in | Wt 243.2 lb

## 2014-08-24 DIAGNOSIS — L97919 Non-pressure chronic ulcer of unspecified part of right lower leg with unspecified severity: Principal | ICD-10-CM

## 2014-08-24 DIAGNOSIS — I83009 Varicose veins of unspecified lower extremity with ulcer of unspecified site: Secondary | ICD-10-CM

## 2014-08-24 DIAGNOSIS — I83019 Varicose veins of right lower extremity with ulcer of unspecified site: Secondary | ICD-10-CM

## 2014-08-24 DIAGNOSIS — L97909 Non-pressure chronic ulcer of unspecified part of unspecified lower leg with unspecified severity: Secondary | ICD-10-CM

## 2014-08-24 NOTE — Progress Notes (Signed)
Today for followup of his left leg venous hypertension. Has undergone prior left great saphenous vein ablation and also anterior saphenous vein ablation with phlebectomy of tributary varicosities. He has known severe deep venous reflux as well. He is extremely compliant with this elevation and compression. He is seen today with recurrent ulceration which is quite superficial over the lateral and medial ankle. No other change in his medical history  Past Medical History  Diagnosis Date  . Varicose veins   . Ulcer   . Seizure disorder     LAST SEIZURE 1993    History  Substance Use Topics  . Smoking status: Never Smoker   . Smokeless tobacco: Never Used  . Alcohol Use: No    Family History  Problem Relation Age of Onset  . Heart disease Father   . Diabetes Paternal Grandmother     No Known Allergies  Current outpatient prescriptions:phenytoin (DILANTIN) 100 MG ER capsule, Take 100 mg by mouth 3 (three) times daily., Disp: , Rfl: ;  silver sulfADIAZINE (SILVADENE) 1 % cream, Apply 1 application topically daily., Disp: 50 g, Rfl: 0  BP 115/76  Pulse 88  Resp 18  Ht  (1.88 m)  Wt 243 lb 3.2 oz (110.315 kg)  BMI 31.21 kg/m2  Body mass index is 31.21 kg/(m^2).       Physical exam yes to 3+ dorsalis pedis pulse. Does have changes of marked venous hypertension with hemosiderin deposits over his distal In the gaiter area. He does have a superficial excoriation over the medial and lateral ankles. No evidence of invasive infection  He did not undergo formal venous duplex today. I did image his leg with to SonoSite ultrasound. This does show successful ablation of his great saphenous and anterior accessory branch. He does have tributaries collateral flow.  I will discuss with patient. I feel that this is related to deep venous hypertension. Explained to have a treatment for the superficial birdhouses but did not feel this is causing a great deal of difficulty currently. Would  recommend continued elevation compression. If he has worsening symptoms we did a formal venous duplex to determine the extent of his tributary collateral superficial reflux

## 2014-12-27 ENCOUNTER — Telehealth: Payer: Self-pay

## 2014-12-27 NOTE — Telephone Encounter (Signed)
-----   Message from Fredrich Birks sent at 12/27/2014 10:20 AM EST ----- Regarding: Triage Andee Lineman,  Mr Khawaja would like to speak with a nurse regarding some new blisters that have appeared on his R foot. He states the area is very tender. He can be reached today at [phone].  Thank you! Annabelle Harman

## 2014-12-27 NOTE — Telephone Encounter (Signed)
Phone call from pt.  Reported onset of raised areas on outer aspect of right foot, approx. 1/2-1" up from the bottom of the foot.  Stated these area are not opened up, but are tender.  Stated he noticed one last week, which improved, but now sees a cluster of them today.  Reported he had been wearing "low top sneakers," and thought the shoes could have been irritating the side of the foot.  Has since changed to high top sneakers.  Stated there is no swelling in the right LE or foot.  Also c/o a constant itching in the right lower leg from ankle to knee, "in the calf region.  Reported he doesn't see a rash, but notices that with scratching it will bleed a little, then heal up.  Questioned about any recommendation for topical cream to prevent the itching.  Stated there is a tenderness in the inner and outer aspect of the right ankle; reported he applies Silvadine cream to the area of tenderness, uses gauze dressing, and wears his compression hose faithfully.  Advised will discuss with Dr. Arbie Cookey 1/5, and notify pt. with recommendations. (reported he will be out of town on Business 1/10-1/14)

## 2014-12-28 ENCOUNTER — Telehealth: Payer: Self-pay | Admitting: *Deleted

## 2014-12-28 NOTE — Telephone Encounter (Addendum)
-----   Message from Erenest Blankarol Sue Pullins, RN sent at 12/28/2014 11:17 AM EST ----- Regarding: nurse visit Please add this pt. to nurse schedule on 12/30/14 @ 1:30 PM. He wants to be measured for compression hose. (I did tell Lamar LaundrySonya about this)  12/28/14: added, dpm

## 2014-12-28 NOTE — Telephone Encounter (Signed)
Discussed pt's symptoms with Dr. Arbie CookeyEarly.  Stated there is no need to bring pt. in for exam, based on symptoms reported.   Advised that pt. can use Silvadine cream to the areas that itch, and continue to monitor.  Advised pt. Of Dr. Bosie HelperEarly's recommendations.  Encouraged to continue to monitor, and report worsening symptoms.  Requested to come in to office this week to get new compression stockings.  Advised the vein nurse would be available 1/6, or in afternoon on 1/7.  Verb. Understanding.

## 2014-12-30 ENCOUNTER — Encounter (INDEPENDENT_AMBULATORY_CARE_PROVIDER_SITE_OTHER): Payer: Self-pay

## 2014-12-30 DIAGNOSIS — I83011 Varicose veins of right lower extremity with ulcer of thigh: Secondary | ICD-10-CM

## 2015-09-08 ENCOUNTER — Encounter (INDEPENDENT_AMBULATORY_CARE_PROVIDER_SITE_OTHER): Payer: Self-pay

## 2015-09-08 DIAGNOSIS — I868 Varicose veins of other specified sites: Secondary | ICD-10-CM

## 2016-06-05 DIAGNOSIS — I868 Varicose veins of other specified sites: Secondary | ICD-10-CM

## 2016-09-03 ENCOUNTER — Encounter (INDEPENDENT_AMBULATORY_CARE_PROVIDER_SITE_OTHER): Payer: Self-pay

## 2016-09-03 DIAGNOSIS — I868 Varicose veins of other specified sites: Secondary | ICD-10-CM

## 2016-10-23 DIAGNOSIS — J069 Acute upper respiratory infection, unspecified: Secondary | ICD-10-CM | POA: Insufficient documentation

## 2017-01-16 DIAGNOSIS — T50905A Adverse effect of unspecified drugs, medicaments and biological substances, initial encounter: Secondary | ICD-10-CM | POA: Insufficient documentation

## 2017-01-16 DIAGNOSIS — L858 Other specified epidermal thickening: Secondary | ICD-10-CM | POA: Insufficient documentation

## 2017-01-16 DIAGNOSIS — E669 Obesity, unspecified: Secondary | ICD-10-CM | POA: Insufficient documentation

## 2017-06-21 ENCOUNTER — Encounter (INDEPENDENT_AMBULATORY_CARE_PROVIDER_SITE_OTHER): Payer: Self-pay

## 2017-07-11 ENCOUNTER — Encounter (INDEPENDENT_AMBULATORY_CARE_PROVIDER_SITE_OTHER): Payer: BLUE CROSS/BLUE SHIELD

## 2017-07-12 DIAGNOSIS — I868 Varicose veins of other specified sites: Secondary | ICD-10-CM

## 2017-08-26 ENCOUNTER — Encounter (HOSPITAL_COMMUNITY): Payer: Self-pay | Admitting: Emergency Medicine

## 2017-08-26 ENCOUNTER — Emergency Department (HOSPITAL_COMMUNITY)
Admission: EM | Admit: 2017-08-26 | Discharge: 2017-08-26 | Disposition: A | Discharge status: Home / Self Care | Payer: BLUE CROSS/BLUE SHIELD | Attending: Emergency Medicine | Admitting: Emergency Medicine

## 2017-08-26 ENCOUNTER — Emergency Department (HOSPITAL_COMMUNITY): Payer: BLUE CROSS/BLUE SHIELD

## 2017-08-26 DIAGNOSIS — Y999 Unspecified external cause status: Secondary | ICD-10-CM | POA: Diagnosis not present

## 2017-08-26 DIAGNOSIS — T07XXXA Unspecified multiple injuries, initial encounter: Secondary | ICD-10-CM | POA: Insufficient documentation

## 2017-08-26 DIAGNOSIS — T148XXA Other injury of unspecified body region, initial encounter: Secondary | ICD-10-CM | POA: Diagnosis not present

## 2017-08-26 DIAGNOSIS — S62617A Displaced fracture of proximal phalanx of left little finger, initial encounter for closed fracture: Secondary | ICD-10-CM | POA: Diagnosis not present

## 2017-08-26 DIAGNOSIS — S6992XA Unspecified injury of left wrist, hand and finger(s), initial encounter: Secondary | ICD-10-CM | POA: Diagnosis not present

## 2017-08-26 DIAGNOSIS — Y9355 Activity, bike riding: Secondary | ICD-10-CM | POA: Diagnosis not present

## 2017-08-26 DIAGNOSIS — S80211A Abrasion, right knee, initial encounter: Secondary | ICD-10-CM | POA: Diagnosis not present

## 2017-08-26 DIAGNOSIS — S50311A Abrasion of right elbow, initial encounter: Secondary | ICD-10-CM | POA: Diagnosis not present

## 2017-08-26 DIAGNOSIS — Z79899 Other long term (current) drug therapy: Secondary | ICD-10-CM | POA: Insufficient documentation

## 2017-08-26 DIAGNOSIS — S41109A Unspecified open wound of unspecified upper arm, initial encounter: Secondary | ICD-10-CM | POA: Diagnosis not present

## 2017-08-26 DIAGNOSIS — Y92488 Other paved roadways as the place of occurrence of the external cause: Secondary | ICD-10-CM | POA: Insufficient documentation

## 2017-08-26 DIAGNOSIS — S80212A Abrasion, left knee, initial encounter: Secondary | ICD-10-CM | POA: Diagnosis not present

## 2017-08-26 DIAGNOSIS — S62619A Displaced fracture of proximal phalanx of unspecified finger, initial encounter for closed fracture: Secondary | ICD-10-CM

## 2017-08-26 MED ORDER — HYDROCODONE-ACETAMINOPHEN 5-325 MG PO TABS: 2.0000 | ORAL_TABLET | ORAL | 0 refills | Status: DC | PRN

## 2017-08-26 MED ORDER — BACITRACIN ZINC 500 UNIT/GM EX OINT
TOPICAL_OINTMENT | CUTANEOUS | Status: AC
  Administered 2017-08-26: 1
  Filled 2017-08-26: qty 0.9
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
## 2017-08-27 ENCOUNTER — Encounter (HOSPITAL_BASED_OUTPATIENT_CLINIC_OR_DEPARTMENT_OTHER)
Admission: RE | Disposition: A | Discharge status: Home / Self Care | Payer: Self-pay | Source: Ambulatory Visit | Attending: Orthopedic Surgery

## 2017-08-27 ENCOUNTER — Ambulatory Visit (HOSPITAL_BASED_OUTPATIENT_CLINIC_OR_DEPARTMENT_OTHER)
Admission: RE | Admit: 2017-08-27 | Discharge: 2017-08-27 | Disposition: A | Discharge status: Home / Self Care | Payer: BLUE CROSS/BLUE SHIELD | Source: Ambulatory Visit | Attending: Orthopedic Surgery | Admitting: Orthopedic Surgery

## 2017-08-27 ENCOUNTER — Ambulatory Visit (HOSPITAL_BASED_OUTPATIENT_CLINIC_OR_DEPARTMENT_OTHER): Payer: BLUE CROSS/BLUE SHIELD | Admitting: Certified Registered Nurse Anesthetist

## 2017-08-27 ENCOUNTER — Other Ambulatory Visit: Payer: Self-pay | Admitting: Orthopedic Surgery

## 2017-08-27 ENCOUNTER — Encounter (HOSPITAL_BASED_OUTPATIENT_CLINIC_OR_DEPARTMENT_OTHER): Payer: Self-pay | Admitting: *Deleted

## 2017-08-27 DIAGNOSIS — S40812A Abrasion of left upper arm, initial encounter: Secondary | ICD-10-CM | POA: Diagnosis not present

## 2017-08-27 DIAGNOSIS — Y998 Other external cause status: Secondary | ICD-10-CM | POA: Insufficient documentation

## 2017-08-27 DIAGNOSIS — S40811A Abrasion of right upper arm, initial encounter: Secondary | ICD-10-CM | POA: Diagnosis not present

## 2017-08-27 DIAGNOSIS — S62617A Displaced fracture of proximal phalanx of left little finger, initial encounter for closed fracture: Secondary | ICD-10-CM | POA: Diagnosis not present

## 2017-08-27 DIAGNOSIS — Z8249 Family history of ischemic heart disease and other diseases of the circulatory system: Secondary | ICD-10-CM | POA: Diagnosis not present

## 2017-08-27 DIAGNOSIS — G40909 Epilepsy, unspecified, not intractable, without status epilepticus: Secondary | ICD-10-CM | POA: Diagnosis not present

## 2017-08-27 DIAGNOSIS — Y9355 Activity, bike riding: Secondary | ICD-10-CM | POA: Diagnosis not present

## 2017-08-27 DIAGNOSIS — Z79899 Other long term (current) drug therapy: Secondary | ICD-10-CM | POA: Insufficient documentation

## 2017-08-27 DIAGNOSIS — I83008 Varicose veins of unspecified lower extremity with ulcer other part of lower leg: Secondary | ICD-10-CM | POA: Diagnosis not present

## 2017-08-27 HISTORY — PX: CLOSED REDUCTION FINGER WITH PERCUTANEOUS PINNING: SHX5612

## 2017-08-27 SURGERY — CLOSED REDUCTION, FINGER, WITH PERCUTANEOUS PINNING
Anesthesia: Monitor Anesthesia Care | Site: Hand | Laterality: Left

## 2017-08-27 MED ORDER — PROPOFOL 500 MG/50ML IV EMUL
INTRAVENOUS | Status: AC
  Filled 2017-08-27: qty 100

## 2017-08-27 MED ORDER — DEXAMETHASONE SODIUM PHOSPHATE 4 MG/ML IJ SOLN
INTRAMUSCULAR | Status: DC | PRN
  Administered 2017-08-27: 10 mg via INTRAVENOUS

## 2017-08-27 MED ORDER — LIDOCAINE HCL (CARDIAC) 20 MG/ML IV SOLN
INTRAVENOUS | Status: DC | PRN
  Administered 2017-08-27: 30 mg via INTRAVENOUS

## 2017-08-27 MED ORDER — FENTANYL CITRATE (PF) 100 MCG/2ML IJ SOLN
INTRAMUSCULAR | Status: DC | PRN
  Administered 2017-08-27 (×2): 25 ug via INTRAVENOUS
  Administered 2017-08-27: 100 ug via INTRAVENOUS

## 2017-08-27 MED ORDER — OXYCODONE HCL 5 MG/5ML PO SOLN: 5.0000 mg | Freq: Once | ORAL | Status: DC | PRN

## 2017-08-27 MED ORDER — LIDOCAINE 2% (20 MG/ML) 5 ML SYRINGE
INTRAMUSCULAR | Status: DC | PRN
  Administered 2017-08-27: 30 mg via INTRAVENOUS

## 2017-08-27 MED ORDER — FENTANYL CITRATE (PF) 100 MCG/2ML IJ SOLN
INTRAMUSCULAR | Status: AC
  Filled 2017-08-27: qty 2

## 2017-08-27 MED ORDER — ONDANSETRON HCL 4 MG/2ML IJ SOLN
INTRAMUSCULAR | Status: AC
  Filled 2017-08-27: qty 2

## 2017-08-27 MED ORDER — ARTIFICIAL TEARS OPHTHALMIC OINT
TOPICAL_OINTMENT | OPHTHALMIC | Status: AC
  Filled 2017-08-27: qty 3.5

## 2017-08-27 MED ORDER — MIDAZOLAM HCL 2 MG/2ML IJ SOLN: 1.0000 mg | INTRAMUSCULAR | Status: DC | PRN

## 2017-08-27 MED ORDER — HYDROMORPHONE HCL 1 MG/ML IJ SOLN: 0.2500 mg | INTRAMUSCULAR | Status: DC | PRN

## 2017-08-27 MED ORDER — ONDANSETRON HCL 4 MG/2ML IJ SOLN
INTRAMUSCULAR | Status: DC | PRN
  Administered 2017-08-27: 4 mg via INTRAVENOUS

## 2017-08-27 MED ORDER — BUPIVACAINE HCL (PF) 0.5 % IJ SOLN
INTRAMUSCULAR | Status: AC
  Filled 2017-08-27: qty 30

## 2017-08-27 MED ORDER — LACTATED RINGERS IV SOLN
INTRAVENOUS | Status: DC
  Administered 2017-08-27 (×3): via INTRAVENOUS

## 2017-08-27 MED ORDER — CEFAZOLIN SODIUM-DEXTROSE 2-4 GM/100ML-% IV SOLN: 2.0000 g | INTRAVENOUS | Status: DC

## 2017-08-27 MED ORDER — CEFAZOLIN SODIUM-DEXTROSE 2-3 GM-% IV SOLR
INTRAVENOUS | Status: DC | PRN
  Administered 2017-08-27: 2 g via INTRAVENOUS

## 2017-08-27 MED ORDER — BUPIVACAINE HCL (PF) 0.25 % IJ SOLN
INTRAMUSCULAR | Status: DC | PRN
  Administered 2017-08-27: 9 mL

## 2017-08-27 MED ORDER — OXYCODONE HCL 5 MG PO TABS: 5.0000 mg | ORAL_TABLET | Freq: Once | ORAL | Status: DC | PRN

## 2017-08-27 MED ORDER — SCOPOLAMINE 1 MG/3DAYS TD PT72: 1.0000 | MEDICATED_PATCH | Freq: Once | TRANSDERMAL | Status: DC | PRN

## 2017-08-27 MED ORDER — HYDROCODONE-ACETAMINOPHEN 5-325 MG PO TABS: ORAL_TABLET | ORAL | 0 refills | Status: DC

## 2017-08-27 MED ORDER — MIDAZOLAM HCL 5 MG/5ML IJ SOLN
INTRAMUSCULAR | Status: DC | PRN
  Administered 2017-08-27: 2 mg via INTRAVENOUS

## 2017-08-27 MED ORDER — FENTANYL CITRATE (PF) 100 MCG/2ML IJ SOLN: 50.0000 ug | INTRAMUSCULAR | Status: DC | PRN

## 2017-08-27 MED ORDER — PROPOFOL 10 MG/ML IV BOLUS
INTRAVENOUS | Status: DC | PRN
  Administered 2017-08-27: 150 mg via INTRAVENOUS

## 2017-08-27 MED ORDER — CHLORHEXIDINE GLUCONATE 4 % EX LIQD: 60.0000 mL | Freq: Once | CUTANEOUS | Status: DC

## 2017-08-27 MED ORDER — PROMETHAZINE HCL 25 MG/ML IJ SOLN: 6.2500 mg | INTRAMUSCULAR | Status: DC | PRN

## 2017-08-27 MED ORDER — MIDAZOLAM HCL 2 MG/2ML IJ SOLN
INTRAMUSCULAR | Status: AC
  Filled 2017-08-27: qty 2

## 2017-08-27 SURGICAL SUPPLY — 42 items
BANDAGE ACE 3X5.8 VEL STRL LF (GAUZE/BANDAGES/DRESSINGS) ×3 IMPLANT
BLADE SURG 15 STRL LF DISP TIS (BLADE) ×2 IMPLANT
BLADE SURG 15 STRL SS (BLADE) ×6
BNDG CMPR 9X4 STRL LF SNTH (GAUZE/BANDAGES/DRESSINGS) ×1
BNDG ELASTIC 2X5.8 VLCR STR LF (GAUZE/BANDAGES/DRESSINGS) IMPLANT
BNDG ESMARK 4X9 LF (GAUZE/BANDAGES/DRESSINGS) ×3 IMPLANT
BNDG GAUZE ELAST 4 BULKY (GAUZE/BANDAGES/DRESSINGS) ×3 IMPLANT
CHLORAPREP W/TINT 26ML (MISCELLANEOUS) ×3 IMPLANT
CORD BIPOLAR FORCEPS 12FT (ELECTRODE) IMPLANT
COVER BACK TABLE 60X90IN (DRAPES) ×3 IMPLANT
COVER MAYO STAND STRL (DRAPES) ×3 IMPLANT
CUFF TOURNIQUET SINGLE 18IN (TOURNIQUET CUFF) ×3 IMPLANT
DRAPE EXTREMITY T 121X128X90 (DRAPE) ×3 IMPLANT
DRAPE OEC MINIVIEW 54X84 (DRAPES) ×3 IMPLANT
DRAPE SURG 17X23 STRL (DRAPES) ×3 IMPLANT
GAUZE SPONGE 4X4 12PLY STRL (GAUZE/BANDAGES/DRESSINGS) ×3 IMPLANT
GAUZE XEROFORM 1X8 LF (GAUZE/BANDAGES/DRESSINGS) ×3 IMPLANT
GLOVE BIO SURGEON STRL SZ7.5 (GLOVE) ×3 IMPLANT
GLOVE BIOGEL PI IND STRL 8 (GLOVE) ×1 IMPLANT
GLOVE BIOGEL PI IND STRL 8.5 (GLOVE) IMPLANT
GLOVE BIOGEL PI INDICATOR 8 (GLOVE) ×2
GLOVE BIOGEL PI INDICATOR 8.5 (GLOVE) ×2
GLOVE SURG ORTHO 8.0 STRL STRW (GLOVE) ×2 IMPLANT
GOWN STRL REUS W/ TWL LRG LVL3 (GOWN DISPOSABLE) ×1 IMPLANT
GOWN STRL REUS W/TWL LRG LVL3 (GOWN DISPOSABLE) ×3
K-WIRE .035X4 (WIRE) ×4 IMPLANT
NDL HYPO 25X1 1.5 SAFETY (NEEDLE) IMPLANT
NEEDLE HYPO 25X1 1.5 SAFETY (NEEDLE) IMPLANT
NS IRRIG 1000ML POUR BTL (IV SOLUTION) ×3 IMPLANT
PACK BASIN DAY SURGERY FS (CUSTOM PROCEDURE TRAY) ×3 IMPLANT
PAD CAST 3X4 CTTN HI CHSV (CAST SUPPLIES) IMPLANT
PAD CAST 4YDX4 CTTN HI CHSV (CAST SUPPLIES) IMPLANT
PADDING CAST COTTON 3X4 STRL (CAST SUPPLIES)
PADDING CAST COTTON 4X4 STRL (CAST SUPPLIES)
SLEEVE SCD COMPRESS KNEE MED (MISCELLANEOUS) IMPLANT
STOCKINETTE 4X48 STRL (DRAPES) ×3 IMPLANT
SUT ETHILON 3 0 PS 1 (SUTURE) IMPLANT
SUT ETHILON 4 0 PS 2 18 (SUTURE) IMPLANT
SYR BULB 3OZ (MISCELLANEOUS) IMPLANT
SYR CONTROL 10ML LL (SYRINGE) IMPLANT
TOWEL OR 17X24 6PK STRL BLUE (TOWEL DISPOSABLE) ×6 IMPLANT
UNDERPAD 30X30 (UNDERPADS AND DIAPERS) ×3 IMPLANT

## 2017-08-27 NOTE — Op Note (Signed)
I assisted Surgeon(s) and Role:    * Betha LoaKuzma, Kevin, MD - Primary    Cindee Salt* Shreyansh Tiffany, MD - Assisting on the Procedure(s): CLOSED REDUCTION WITH PERCUTANEOUS PINNING LEFT SMALL FINGER on 08/27/2017.  I provided assistance on this case as follows: setup reduction stabilization of the fracture, fixation and application of the dressing and splint. I was present for the entire case.  Electronically signed by: Nicki ReaperKUZMA,Brain Honeycutt R, MD Date: 08/27/2017 Time: 3:36 PM

## 2017-08-27 NOTE — Anesthesia Postprocedure Evaluation (Signed)
Anesthesia Post Note  Patient: Guy BeginJorge Thorup  Procedure(s) Performed: Procedure(s) (LRB): CLOSED REDUCTION WITH PERCUTANEOUS PINNING LEFT SMALL FINGER (Left)     Patient location during evaluation: PACU Anesthesia Type: General Level of consciousness: awake and alert Pain management: pain level controlled Vital Signs Assessment: post-procedure vital signs reviewed and stable Respiratory status: spontaneous breathing, nonlabored ventilation and respiratory function stable Cardiovascular status: blood pressure returned to baseline and stable Postop Assessment: no signs of nausea or vomiting Anesthetic complications: no    Last Vitals:  Vitals:   08/27/17 1630 08/27/17 1645  BP: 119/80 124/75  Pulse: 67 71  Resp: 15 (!) 23  Temp:    SpO2: 100% 100%    Last Pain:  Vitals:   08/27/17 1630  TempSrc:   PainSc: 0-No pain                 Lowella CurbWarren Ray Jaylenn Altier

## 2017-08-27 NOTE — Brief Op Note (Signed)
08/27/2017  3:34 PM  PATIENT:  Guy BeginJorge Halfmann  51 y.o. male  PRE-OPERATIVE DIAGNOSIS:  Left Small Finger Fracture  POST-OPERATIVE DIAGNOSIS:  Left Small Finger Fracture  PROCEDURE:  Procedure(s): CLOSED REDUCTION WITH PERCUTANEOUS PINNING LEFT SMALL FINGER (Left)  SURGEON:  Surgeon(s) and Role:    * Betha LoaKuzma, Roselinda Bahena, MD - Primary    * Cindee SaltKuzma, Gary, MD - Assisting  PHYSICIAN ASSISTANT:   ASSISTANTS: Cindee SaltGary Jacody Beneke, MD   ANESTHESIA:   general  EBL:  Total I/O In: 1000 [I.V.:1000] Out: 2 [Blood:2]  BLOOD ADMINISTERED:none  DRAINS: none   LOCAL MEDICATIONS USED:  MARCAINE     SPECIMEN:  No Specimen  DISPOSITION OF SPECIMEN:  N/A  COUNTS:  YES  TOURNIQUET:  * Missing tourniquet times found for documented tourniquets in log:  [id number] *  DICTATION: .Other Dictation: Dictation Number [id number]  PLAN OF CARE: Discharge to home after PACU  PATIENT DISPOSITION:  PACU - hemodynamically stable.

## 2017-08-27 NOTE — Discharge Instructions (Addendum)

## 2017-08-27 NOTE — Op Note (Signed)
[id number] 

## 2017-08-27 NOTE — Anesthesia Preprocedure Evaluation (Addendum)
Anesthesia Evaluation  Patient identified by MRN, date of birth, ID band Patient awake    Reviewed: Allergy & Precautions, NPO status , Patient's Chart, lab work & pertinent test results  Airway Mallampati: II  TM Distance: >3 FB Neck ROM: Full    Dental no notable dental hx.    Pulmonary neg pulmonary ROS,    Pulmonary exam normal breath sounds clear to auscultation       Cardiovascular negative cardio ROS Normal cardiovascular exam Rhythm:Regular Rate:Normal     Neuro/Psych Seizures -, Well Controlled,  negative neurological ROS  negative psych ROS   GI/Hepatic negative GI ROS, Neg liver ROS,   Endo/Other  negative endocrine ROS  Renal/GU negative Renal ROS  negative genitourinary   Musculoskeletal negative musculoskeletal ROS (+)   Abdominal   Peds negative pediatric ROS (+)  Hematology negative hematology ROS (+)   Anesthesia Other Findings   Reproductive/Obstetrics negative OB ROS                             Anesthesia Physical Anesthesia Plan  ASA: II  Anesthesia Plan: General   Post-op Pain Management:    Induction: Intravenous  PONV Risk Score and Plan: 1 and Ondansetron  Airway Management Planned: Nasal Cannula  Additional Equipment:   Intra-op Plan:   Post-operative Plan:   Informed Consent: I have reviewed the patients History and Physical, chart, labs and discussed the procedure including the risks, benefits and alternatives for the proposed anesthesia with the patient or authorized representative who has indicated his/her understanding and acceptance.   Dental advisory given  Plan Discussed with: CRNA  Anesthesia Plan Comments:       Anesthesia Quick Evaluation

## 2017-08-27 NOTE — Anesthesia Procedure Notes (Signed)
Procedure Name: LMA Insertion Date/Time: 08/27/2017 3:02 PM Performed by: Genevieve NorlanderLINKA, Faustina Gebert L Pre-anesthesia Checklist: Patient identified, Emergency Drugs available, Suction available, Patient being monitored and Timeout performed Patient Re-evaluated:Patient Re-evaluated prior to induction Oxygen Delivery Method: Circle system utilized Preoxygenation: Pre-oxygenation with 100% oxygen Induction Type: IV induction Ventilation: Mask ventilation without difficulty LMA: LMA inserted LMA Size: 5.0 Number of attempts: 1 Airway Equipment and Method: Bite block Placement Confirmation: positive ETCO2 Tube secured with: Tape Dental Injury: Teeth and Oropharynx as per pre-operative assessment

## 2017-08-27 NOTE — Transfer of Care (Signed)
Immediate Anesthesia Transfer of Care Note  Patient: Patrick Ingram  Procedure(s) Performed: Procedure(s): CLOSED REDUCTION WITH PERCUTANEOUS PINNING LEFT SMALL FINGER (Left)  Patient Location: PACU  Anesthesia Type:General  Level of Consciousness: sedated  Airway & Oxygen Therapy: Patient Spontanous Breathing and Patient connected to face mask oxygen  Post-op Assessment: Report given to RN and Post -op Vital signs reviewed and stable  Post vital signs: Reviewed and stable  Last Vitals:  Vitals:   08/27/17 1148  BP: 117/70  Pulse: 76  Resp: 17  Temp: 37.1 C  SpO2: 98%    Last Pain:  Vitals:   08/27/17 1148  TempSrc: Oral  PainSc: 3       Patients Stated Pain Goal: 3 (08/27/17 1148)  Complications: No apparent anesthesia complications
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
## 2017-08-28 ENCOUNTER — Encounter (HOSPITAL_BASED_OUTPATIENT_CLINIC_OR_DEPARTMENT_OTHER): Payer: Self-pay | Admitting: Orthopedic Surgery
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
## 2017-09-03 DIAGNOSIS — S62617D Displaced fracture of proximal phalanx of left little finger, subsequent encounter for fracture with routine healing: Secondary | ICD-10-CM | POA: Diagnosis not present

## 2017-09-03 DIAGNOSIS — M25649 Stiffness of unspecified hand, not elsewhere classified: Secondary | ICD-10-CM | POA: Diagnosis not present

## 2017-09-13 DIAGNOSIS — S62617D Displaced fracture of proximal phalanx of left little finger, subsequent encounter for fracture with routine healing: Secondary | ICD-10-CM | POA: Diagnosis not present

## 2017-09-17 DIAGNOSIS — B349 Viral infection, unspecified: Secondary | ICD-10-CM | POA: Diagnosis not present

## 2017-09-27 ENCOUNTER — Encounter (INDEPENDENT_AMBULATORY_CARE_PROVIDER_SITE_OTHER): Payer: BLUE CROSS/BLUE SHIELD

## 2017-09-27 DIAGNOSIS — S62617D Displaced fracture of proximal phalanx of left little finger, subsequent encounter for fracture with routine healing: Secondary | ICD-10-CM | POA: Diagnosis not present

## 2017-09-27 DIAGNOSIS — I868 Varicose veins of other specified sites: Secondary | ICD-10-CM

## 2017-10-09 DIAGNOSIS — S62617D Displaced fracture of proximal phalanx of left little finger, subsequent encounter for fracture with routine healing: Secondary | ICD-10-CM | POA: Diagnosis not present

## 2017-10-30 DIAGNOSIS — S62617D Displaced fracture of proximal phalanx of left little finger, subsequent encounter for fracture with routine healing: Secondary | ICD-10-CM | POA: Diagnosis not present

## 2017-10-30 DIAGNOSIS — I82409 Acute embolism and thrombosis of unspecified deep veins of unspecified lower extremity: Secondary | ICD-10-CM

## 2017-10-30 HISTORY — DX: Acute embolism and thrombosis of unspecified deep veins of unspecified lower extremity: I82.409

## 2017-11-04 ENCOUNTER — Other Ambulatory Visit: Payer: Self-pay | Admitting: *Deleted

## 2017-11-04 ENCOUNTER — Telehealth: Payer: Self-pay | Admitting: *Deleted

## 2017-11-04 DIAGNOSIS — I83891 Varicose veins of right lower extremities with other complications: Secondary | ICD-10-CM
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
## 2017-11-05 ENCOUNTER — Ambulatory Visit (INDEPENDENT_AMBULATORY_CARE_PROVIDER_SITE_OTHER)
Admission: RE | Admit: 2017-11-05 | Discharge: 2017-11-05 | Disposition: A | Discharge status: Home / Self Care | Payer: BLUE CROSS/BLUE SHIELD | Source: Ambulatory Visit | Attending: Vascular Surgery | Admitting: Vascular Surgery

## 2017-11-05 ENCOUNTER — Ambulatory Visit (INDEPENDENT_AMBULATORY_CARE_PROVIDER_SITE_OTHER): Payer: BLUE CROSS/BLUE SHIELD | Admitting: Vascular Surgery

## 2017-11-05 ENCOUNTER — Other Ambulatory Visit: Payer: Self-pay | Admitting: Vascular Surgery

## 2017-11-05 ENCOUNTER — Encounter: Payer: Self-pay | Admitting: Vascular Surgery

## 2017-11-05 VITALS — BP 127/88 | HR 74 | Temp 97.7°F | Resp 18 | Ht 74.0 in | Wt 254.0 lb

## 2017-11-05 DIAGNOSIS — I82411 Acute embolism and thrombosis of right femoral vein: Secondary | ICD-10-CM

## 2017-11-05 DIAGNOSIS — I83891 Varicose veins of right lower extremities with other complications: Secondary | ICD-10-CM | POA: Diagnosis not present

## 2017-11-05 DIAGNOSIS — I872 Venous insufficiency (chronic) (peripheral): Secondary | ICD-10-CM | POA: Diagnosis not present

## 2017-11-05 DIAGNOSIS — I82401 Acute embolism and thrombosis of unspecified deep veins of right lower extremity: Secondary | ICD-10-CM | POA: Diagnosis not present

## 2017-11-05 DIAGNOSIS — I82421 Acute embolism and thrombosis of right iliac vein: Secondary | ICD-10-CM | POA: Diagnosis not present

## 2017-11-05 DIAGNOSIS — I871 Compression of vein: Secondary | ICD-10-CM | POA: Diagnosis not present

## 2017-11-05 DIAGNOSIS — I82491 Acute embolism and thrombosis of other specified deep vein of right lower extremity: Secondary | ICD-10-CM | POA: Diagnosis not present

## 2017-11-05 DIAGNOSIS — Z79899 Other long term (current) drug therapy: Secondary | ICD-10-CM | POA: Diagnosis not present

## 2017-11-05 DIAGNOSIS — Z8249 Family history of ischemic heart disease and other diseases of the circulatory system: Secondary | ICD-10-CM | POA: Diagnosis not present

## 2017-11-05 DIAGNOSIS — I82502 Chronic embolism and thrombosis of unspecified deep veins of left lower extremity: Secondary | ICD-10-CM | POA: Diagnosis not present

## 2017-11-05 DIAGNOSIS — G40409 Other generalized epilepsy and epileptic syndromes, not intractable, without status epilepticus: Secondary | ICD-10-CM | POA: Diagnosis not present

## 2017-11-05 NOTE — Progress Notes (Signed)
Subjective:     Patient ID: Patrick Ingram, male   DOB: 1966-02-01, 51 y.o.   MRN: [id number]  HPI This 51 year old male is a patient of Dr. Bosie Helperearly's has previously undergone staged bilateral great saphenous vein ablations approximately 2011. He has had some chronic DVT in the left leg. He has a history of stasis ulcers in the left leg. He was last seen here in 2015. Both great saphenous veins have been documented to be closed by laser ablation. 3 days ago the patient developed onset of swelling in the right thigh and calf. This has possibly slightly improved but continues to be significant. He denies any chest pain or hemoptysis. He does not take anticoagulants. He does wear compression stocking on the left leg.  Past Medical History:  Diagnosis Date  . Seizure disorder (HCC)    LAST SEIZURE 1993  . Ulcer   . Varicose veins     Social History   Tobacco Use  . Smoking status: Never Smoker  . Smokeless tobacco: Never Used  Substance Use Topics  . Alcohol use: No    Family History  Problem Relation Age of Onset  . Heart disease Father   . Diabetes Paternal Grandmother     No Known Allergies   Current Outpatient Medications:  .  phenytoin (DILANTIN) 100 MG ER capsule, Take 100 mg by mouth 3 (three) times daily., Disp: , Rfl:  .  HYDROcodone-acetaminophen (NORCO) 5-325 MG tablet, 1-2 tabs po q6 hours prn pain (Patient not taking: Reported on 11/05/2017), Disp: 20 tablet, Rfl: 0 .  HYDROcodone-acetaminophen (NORCO/VICODIN) 5-325 MG tablet, Take 2 tablets by mouth every 4 (four) hours as needed. (Patient not taking: Reported on 11/05/2017), Disp: 16 tablet, Rfl: 0 .  silver sulfADIAZINE (SILVADENE) 1 % cream, Apply 1 application topically daily. (Patient not taking: Reported on 11/05/2017), Disp: 50 g, Rfl: 0  Vitals:   11/05/17 1040  BP: 127/88  Pulse: 74  Resp: 18  Temp: 97.7 F (36.5 C)  TempSrc: Oral  SpO2: 100%  Weight: 254 lb (115.2 kg)  Height: 6\' 2"  (1.88 m)    Body  mass index is 32.61 kg/m.         Review of Systems Denies chest pain, dyspnea on exertion, PND, orthopnea, hemoptysis, claudication    Objective:   Physical Exam BP 127/88 (BP Location: Left Arm, Patient Position: Sitting, Cuff Size: Large)   Pulse 74   Temp 97.7 F (36.5 C) (Oral)   Resp 18   Ht 6\' 2"  (1.88 m)   Wt 254 lb (115.2 kg)   SpO2 100%   BMI 32.61 kg/m     Gen.-alert and oriented x3 in no apparent distress HEENT normal for age Lungs no rhonchi or wheezing Cardiovascular regular rhythm no murmurs carotid pulses 3+ palpable no bruits audible Abdomen soft nontender no palpable masses Musculoskeletal free of  major deformities Skin clear -no rashes Neurologic normal Lower extremities 3+ femoral and dorsalis pedis pulses palpable bilaterally with no edema on the left-elastic compression stocking in place 2+ edema on the right from the ankle to the inguinal crease. Right foot well perfused with 2+ dorsalis pedis pulse palpable  Today I ordered a venous duplex exam of the right leg which reveals acute DVT from the distal superficial femoral vein up to the very distal aspect of the right external iliac vein with occlusion of the great saphenous vein      Assessment:     #1 status post bilateral laser ablation great  saphenous veins in 2011 #2 acute DVT right leg involving common femoral and superficial femoral vein with acute symptoms beginning 3 days ago    Plan:     Patient will be urgently admitted to the hospital for IV heparin and subsequent venogram and possible thrombo-lysis if appropriate

## 2017-11-06 ENCOUNTER — Other Ambulatory Visit: Payer: Self-pay

## 2017-11-06 ENCOUNTER — Inpatient Hospital Stay (HOSPITAL_COMMUNITY)
Admission: EM | Admit: 2017-11-06 | Discharge: 2017-11-09 | DRG: 271 | Disposition: A | Discharge status: Home / Self Care | Payer: BLUE CROSS/BLUE SHIELD | Attending: Surgery | Admitting: Surgery

## 2017-11-06 ENCOUNTER — Encounter (HOSPITAL_COMMUNITY): Payer: Self-pay | Admitting: *Deleted

## 2017-11-06 DIAGNOSIS — Z8249 Family history of ischemic heart disease and other diseases of the circulatory system: Secondary | ICD-10-CM | POA: Diagnosis not present

## 2017-11-06 DIAGNOSIS — Z79899 Other long term (current) drug therapy: Secondary | ICD-10-CM | POA: Diagnosis not present

## 2017-11-06 DIAGNOSIS — I82401 Acute embolism and thrombosis of unspecified deep veins of right lower extremity: Secondary | ICD-10-CM | POA: Diagnosis not present

## 2017-11-06 DIAGNOSIS — I82421 Acute embolism and thrombosis of right iliac vein: Secondary | ICD-10-CM | POA: Diagnosis present

## 2017-11-06 DIAGNOSIS — I872 Venous insufficiency (chronic) (peripheral): Secondary | ICD-10-CM | POA: Diagnosis present

## 2017-11-06 DIAGNOSIS — I82502 Chronic embolism and thrombosis of unspecified deep veins of left lower extremity: Secondary | ICD-10-CM | POA: Diagnosis present

## 2017-11-06 DIAGNOSIS — I824Y9 Acute embolism and thrombosis of unspecified deep veins of unspecified proximal lower extremity: Secondary | ICD-10-CM | POA: Diagnosis present

## 2017-11-06 DIAGNOSIS — G40409 Other generalized epilepsy and epileptic syndromes, not intractable, without status epilepticus: Secondary | ICD-10-CM | POA: Diagnosis present

## 2017-11-06 DIAGNOSIS — I82411 Acute embolism and thrombosis of right femoral vein: Secondary | ICD-10-CM | POA: Diagnosis present

## 2017-11-06 DIAGNOSIS — I871 Compression of vein: Secondary | ICD-10-CM | POA: Diagnosis present

## 2017-11-06 DIAGNOSIS — I82419 Acute embolism and thrombosis of unspecified femoral vein: Secondary | ICD-10-CM | POA: Diagnosis present

## 2017-11-06 DIAGNOSIS — I82491 Acute embolism and thrombosis of other specified deep vein of right lower extremity: Secondary | ICD-10-CM | POA: Diagnosis not present

## 2017-11-06 HISTORY — DX: Other generalized epilepsy and epileptic syndromes, not intractable, without status epilepticus: G40.409

## 2017-11-06 HISTORY — DX: Acute embolism and thrombosis of unspecified deep veins of unspecified lower extremity: I82.409

## 2017-11-06 HISTORY — DX: Non-pressure chronic ulcer of unspecified ankle with unspecified severity: L97.309

## 2017-11-06 LAB — CBC WITH DIFFERENTIAL/PLATELET
BASOS ABS: 0 10*3/uL (ref 0.0–0.1)
BASOS PCT: 0 %
EOS ABS: 0.5 10*3/uL (ref 0.0–0.7)
EOS PCT: 5 %
HCT: 39.7 % (ref 39.0–52.0)
Hemoglobin: 13.1 g/dL (ref 13.0–17.0)
Lymphocytes Relative: 22 %
Lymphs Abs: 2 10*3/uL (ref 0.7–4.0)
MCH: 27.8 pg (ref 26.0–34.0)
MCHC: 33 g/dL (ref 30.0–36.0)
MCV: 84.3 fL (ref 78.0–100.0)
MONO ABS: 0.5 10*3/uL (ref 0.1–1.0)
Monocytes Relative: 6 %
Neutro Abs: 5.9 10*3/uL (ref 1.7–7.7)
Neutrophils Relative %: 67 %
PLATELETS: 208 10*3/uL (ref 150–400)
RBC: 4.71 MIL/uL (ref 4.22–5.81)
RDW: 15.5 % (ref 11.5–15.5)
WBC: 8.9 10*3/uL (ref 4.0–10.5)

## 2017-11-06 LAB — I-STAT CHEM 8, ED
BUN: 17 mg/dL (ref 6–20)
CALCIUM ION: 1.18 mmol/L (ref 1.15–1.40)
CHLORIDE: 104 mmol/L (ref 101–111)
Creatinine, Ser: 0.7 mg/dL (ref 0.61–1.24)
Glucose, Bld: 99 mg/dL (ref 65–99)
HEMATOCRIT: 41 % (ref 39.0–52.0)
Hemoglobin: 13.9 g/dL (ref 13.0–17.0)
Potassium: 4.4 mmol/L (ref 3.5–5.1)
SODIUM: 140 mmol/L (ref 135–145)
TCO2: 28 mmol/L (ref 22–32)

## 2017-11-06 LAB — PROTIME-INR
INR: 0.98
INR: 0.98
Prothrombin Time: 12.9 seconds (ref 11.4–15.2)
Prothrombin Time: 12.9 seconds (ref 11.4–15.2)

## 2017-11-06 LAB — URINALYSIS, ROUTINE W REFLEX MICROSCOPIC
Bilirubin Urine: NEGATIVE
Glucose, UA: NEGATIVE mg/dL
HGB URINE DIPSTICK: NEGATIVE
Ketones, ur: NEGATIVE mg/dL
Leukocytes, UA: NEGATIVE
Nitrite: NEGATIVE
PROTEIN: NEGATIVE mg/dL
Specific Gravity, Urine: 1.018 (ref 1.005–1.030)
pH: 7 (ref 5.0–8.0)

## 2017-11-06 LAB — HEPARIN LEVEL (UNFRACTIONATED): HEPARIN UNFRACTIONATED: 0.64 [IU]/mL (ref 0.30–0.70)

## 2017-11-06 MED ORDER — METOPROLOL TARTRATE 5 MG/5ML IV SOLN: 2.0000 mg | INTRAVENOUS | Status: DC | PRN

## 2017-11-06 MED ORDER — POTASSIUM CHLORIDE CRYS ER 20 MEQ PO TBCR: 20.0000 meq | EXTENDED_RELEASE_TABLET | Freq: Once | ORAL | Status: DC

## 2017-11-06 MED ORDER — HEPARIN (PORCINE) IN NACL 100-0.45 UNIT/ML-% IJ SOLN
1900.0000 [IU]/h | INTRAMUSCULAR | Status: DC
  Administered 2017-11-06 – 2017-11-07 (×2): 1900 [IU]/h via INTRAVENOUS
  Filled 2017-11-06 (×2): qty 250

## 2017-11-06 MED ORDER — PHENYTOIN SODIUM EXTENDED 100 MG PO CAPS
400.0000 mg | ORAL_CAPSULE | Freq: Every day | ORAL | Status: DC
  Administered 2017-11-07 – 2017-11-09 (×3): 400 mg via ORAL
  Filled 2017-11-06 (×3): qty 4

## 2017-11-06 MED ORDER — ACETAMINOPHEN 325 MG PO TABS
650.0000 mg | ORAL_TABLET | ORAL | Status: DC | PRN
  Administered 2017-11-06 – 2017-11-07 (×3): 650 mg via ORAL
  Filled 2017-11-06 (×3): qty 2

## 2017-11-06 MED ORDER — ONDANSETRON HCL 4 MG/2ML IJ SOLN: 4.0000 mg | Freq: Four times a day (QID) | INTRAMUSCULAR | Status: DC | PRN

## 2017-11-06 MED ORDER — LABETALOL HCL 5 MG/ML IV SOLN
10.0000 mg | INTRAVENOUS | Status: DC | PRN
  Filled 2017-11-06: qty 4

## 2017-11-06 MED ORDER — HEPARIN BOLUS VIA INFUSION
7000.0000 [IU] | Freq: Once | INTRAVENOUS | Status: AC
  Administered 2017-11-06: 7000 [IU] via INTRAVENOUS
  Filled 2017-11-06: qty 7000

## 2017-11-06 MED ORDER — HEPARIN BOLUS VIA INFUSION: 60.0000 [IU]/kg | Freq: Once | INTRAVENOUS | Status: DC

## 2017-11-06 MED ORDER — PHENYTOIN 50 MG PO CHEW: 400.0000 mg | CHEWABLE_TABLET | Freq: Every day | ORAL | Status: DC

## 2017-11-06 MED ORDER — HYDRALAZINE HCL 20 MG/ML IJ SOLN: 5.0000 mg | INTRAMUSCULAR | Status: DC | PRN

## 2017-11-06 MED ORDER — ALUM & MAG HYDROXIDE-SIMETH 200-200-20 MG/5ML PO SUSP: 15.0000 mL | ORAL | Status: DC | PRN

## 2017-11-06 MED ORDER — GUAIFENESIN-DM 100-10 MG/5ML PO SYRP
15.0000 mL | ORAL_SOLUTION | ORAL | Status: DC | PRN
  Filled 2017-11-06: qty 15

## 2017-11-06 MED ORDER — PHENOL 1.4 % MT LIQD
1.0000 | OROMUCOSAL | Status: DC | PRN
  Filled 2017-11-06: qty 177

## 2017-11-06 MED ORDER — PANTOPRAZOLE SODIUM 40 MG PO TBEC
40.0000 mg | DELAYED_RELEASE_TABLET | Freq: Every day | ORAL | Status: DC
  Administered 2017-11-06 – 2017-11-09 (×4): 40 mg via ORAL
  Filled 2017-11-06 (×4): qty 1

## 2017-11-06 MED ORDER — SODIUM CHLORIDE 0.9 % IV SOLN
INTRAVENOUS | Status: DC
  Administered 2017-11-06 – 2017-11-07 (×3): via INTRAVENOUS

## 2017-11-06 MED ORDER — ENOXAPARIN SODIUM 120 MG/0.8ML ~~LOC~~ SOLN
1.0000 mg/kg | Freq: Once | SUBCUTANEOUS | Status: DC
  Filled 2017-11-06: qty 0.8

## 2017-11-06 NOTE — ED Triage Notes (Signed)
PT sent here by Dr Bosie HelperEarly's office to be admitting for Lovenox tx of large DVT to R upper leg.

## 2017-11-06 NOTE — Progress Notes (Signed)
ANTICOAGULATION CONSULT NOTE  Pharmacy Consult for heparin Indication: DVT  Heparin Dosing Weight: 106.4 kg   Assessment: 51 yom admitted with acute RLE DVT. Hx of chronic DVT in L leg. Pharmacy consulted to dose heparin with bolus. Possibly will have Vascular Surgery procedure for thrombolysis. CBC wnl.  1st heparin level is 0.64 on heparin 1900 units/hr, therapeutic.   No bleed reported.   Not on anticoagulation PTA and no bleeding history noted.  Goal of Therapy:  Heparin level 0.3-0.7 units/ml Monitor platelets by anticoagulation protocol: Yes   Plan:  Continue IV heparin at 1900 units/hr 6h heparin level Daily heparin level/CBC Monitor s/sx bleeding   Patrick Ingram, RPh Clinical Pharmacist 11/06/2017 10:32 PM

## 2017-11-06 NOTE — Progress Notes (Signed)
Patient arrived to 6n25, VSS, IV fluids and heparin infusing. Patient noted to have red swollen right leg. Oriented to room and staff, wife at bedside, will continue to monitor.

## 2017-11-06 NOTE — ED Provider Notes (Signed)
[facility] Provider Note   CSN: [id number] Arrival date & time: 11/06/17  0945     History   Chief Complaint Chief Complaint  Patient presents with  . Leg Pain    admit     HPI Guy BeginJorge Dann is a 51 y.o. male.  HPI   This 51 year old male with history of seizure, chronic venous insufficiency, venous stasis ulcer presenting to the ER to be admitted for new DVT of the right leg.  Patient with history of chronic DVT in the left leg.  Also previously undergone bilateral great saphenous vein ablation in 2011.  Patient developed pain and swelling to the right thigh and calf approximately 4 days ago.  He was seen by vascular surgery yesterday for his leg pain.  A venous duplex study was obtained which rerealed acute DVT from the distal superficial femoral vein up to the very distal aspect of the right external iliac vein with occlusion of the right saphenous vein.  Patient was recommended to be admitted to the hospital for IV heparin and subsequent venogram and possible thrombolysis if appropriate.  However, due to no bed available, patient returns today to the ER to be admitted.  He is currently denies having any fever, chills, lightheadedness, dizziness, chest pain, shortness of breath, hemoptysis, abdominal pain. Did report L pinky finger surgery 2 months ago.    Past Medical History:  Diagnosis Date  . Seizure disorder (HCC)    LAST SEIZURE 1993  . Ulcer   . Varicose veins     Patient Active Problem List   Diagnosis Date Noted  . Acute deep vein thrombosis (DVT) of femoral vein of right lower extremity (HCC) 11/05/2017  . Varicose veins of lower extremities with ulcer (HCC) 06/25/2013  . Venous stasis ulcer (HCC) 06/09/2013  . Tenderness-left lateral ankle 06/09/2013  . Chronic venous insufficiency 06/09/2013  . Varicose veins of lower extremities with other complications 03/18/2012    Past Surgical History:  Procedure Laterality Date    . ENDOVENOUS ABLATION SAPHENOUS VEIN W/ LASER Left 06-25-2013   left anterior saphenous vein and stab phlebectomy 10-20 incisions left leg  by Gretta Beganodd Early MD  . LASER ABLATION     BOTH LEGS       Home Medications    Prior to Admission medications   Medication Sig Start Date End Date Taking? Authorizing Provider  HYDROcodone-acetaminophen (NORCO) 5-325 MG tablet 1-2 tabs po q6 hours prn pain Patient not taking: Reported on 11/05/2017 08/27/17   Betha LoaKuzma, Kevin, MD  HYDROcodone-acetaminophen (NORCO/VICODIN) 5-325 MG tablet Take 2 tablets by mouth every 4 (four) hours as needed. Patient not taking: Reported on 11/05/2017 08/26/17   Elson AreasSofia, Leslie K, PA-C  phenytoin (DILANTIN) 100 MG ER capsule Take 100 mg by mouth 3 (three) times daily.    [provider]  silver sulfADIAZINE (SILVADENE) 1 % cream Apply 1 application topically daily. Patient not taking: Reported on 11/05/2017 07/01/14   Early, Kristen Loaderodd F, MD    Family History Family History  Problem Relation Age of Onset  . Heart disease Father   . Diabetes Paternal Grandmother     Social History Social History   Tobacco Use  . Smoking status: Never Smoker  . Smokeless tobacco: Never Used  Substance Use Topics  . Alcohol use: No  . Drug use: No     Allergies   Patient has no known allergies.   Review of Systems Review of Systems  All other systems reviewed and  are negative.    Physical Exam Updated Vital Signs BP 134/89 (BP Location: Right Arm)   Pulse 87   Temp 98.2 F (36.8 C) (Oral)   Resp 18   Ht 6\' 2"  (1.88 m)   Wt 114.8 kg (253 lb)   SpO2 100%   BMI 32.48 kg/m   Physical Exam  Constitutional: He is oriented to person, place, and time. He appears well-developed and well-nourished. No distress.  HENT:  Head: Atraumatic.  Eyes: Conjunctivae are normal.  Neck: Neck supple.  Cardiovascular: Normal rate, regular rhythm and intact distal pulses.  Pulmonary/Chest: Effort normal and breath sounds normal.   Musculoskeletal: He exhibits edema (2+ pitting edema to right lower extremity with evidence of chronic venous stasis.  Intact pedal pulses.).  Neurological: He is alert and oriented to person, place, and time.  Skin: No rash noted.  Psychiatric: He has a normal mood and affect.  Nursing note and vitals reviewed.    ED Treatments / Results  Labs (all labs ordered are listed, but only abnormal results are displayed) Labs Reviewed  CBC WITH DIFFERENTIAL/PLATELET  PROTIME-INR  I-STAT CHEM 8, ED    EKG  EKG Interpretation None       Radiology No results found.  Procedures Procedures (including critical care time)  Medications Ordered in ED Medications  enoxaparin (LOVENOX) injection 115 mg (not administered)     Initial Impression / Assessment and Plan / ED Course  I have reviewed the triage vital signs and the nursing notes.  Pertinent labs & imaging results that were available during my care of the patient were reviewed by me and considered in my medical decision making (see chart for details).     BP 134/89 (BP Location: Right Arm)   Pulse 87   Temp 98.2 F (36.8 C) (Oral)   Resp 18   Ht 6\' 2"  (1.88 m)   Wt 114.8 kg (253 lb)   SpO2 100%   BMI 32.48 kg/m    Final Clinical Impressions(s) / ED Diagnoses   Final diagnoses:  Acute deep vein thrombosis (DVT) of right lower extremity, unspecified vein [facility](HCC)    ED Discharge Orders    None     12:52 PM Patient here with newly diagnosed right lower extremity DVT, with recommendation from vascular surgeon to be admitted for IV heparin, and potential thrombolysis.  Patient made n.p.o., Lovenox given, will check basic labs and will consult for admission.  1:52 PM Vascular Surgeon Dr. Earle GellBradham will see pt in the ER and will admit for further care.  Care discussed with Dr. Ethelda ChickJacubowitz.    Fayrene Helperran, Sarayah Bacchi, PA-C 11/06/17 1354    Doug SouJacubowitz, Sam, MD 11/06/17 1710

## 2017-11-06 NOTE — Progress Notes (Signed)
ANTICOAGULATION CONSULT NOTE  Pharmacy Consult for heparin Indication: DVT  Heparin Dosing Weight: 106.4 kg   Assessment: 51 yom admitted with acute RLE DVT. Hx of chronic DVT in L leg. Pharmacy consulted to dose heparin with bolus. Possibly will have Vascular Surgery procedure for thrombolysis. CBC wnl. No bleed documented. Not on anticoagulation PTA and no bleeding history noted. Lovenox initially ordered - not given.  Goal of Therapy:  Heparin level 0.3-0.7 units/ml Monitor platelets by anticoagulation protocol: Yes   Plan:  Heparin 7000 unit bolus Start heparin at 1900 units/h 6h heparin level Daily heparin level/CBC Monitor s/sx bleeding   Babs BertinHaley Shanea Karney, PharmD, BCPS Clinical Pharmacist 11/06/2017 2:08 PM

## 2017-11-06 NOTE — ED Triage Notes (Signed)
Pt's MD office called: Pt is pos for DVT- came yesterday to be admitted and left due to long wait and not getting bed. MD office wants pt to be admitted through ED and start lovenox per Dr. Hart RochesterLawson.

## 2017-11-06 NOTE — ED Notes (Signed)
Attempted report 

## 2017-11-06 NOTE — H&P (Addendum)
[facility] [place]  Patient name: Patrick Ingram MRN: [id number] DOB: 24-Dec-1966 Sex: male    REASON FOR ADMISSION:    Right leg DVT  HISTORY OF PRESENT ILLNESS:   Patrick Ingram is a 51 y.o. male, who presented to the office yesterday with complaints of pain and swelling in his right leg which has been present since last Wednesday.  The patient has a history of undergoing bilateral endovenous laser ablation.  He has had trouble with leg swelling and venous ulcers in the past.  He is not on anticoagulation.  His only other medical problem is a seizure disorder for which he is on Dilantin.  His last seizure was in 1993.  Patient did have pins placed in his finger on the left arm Labor Day.  He has no history of intracranial procedures.  He does not have any clotting disorders.  PAST MEDICAL HISTORY    Past Medical History:  Diagnosis Date  . Seizure disorder (HCC)    LAST SEIZURE 1993  . Ulcer   . Varicose veins      FAMILY HISTORY   Family History  Problem Relation Age of Onset  . Heart disease Father   . Diabetes Paternal Grandmother     SOCIAL HISTORY:   Social History   Socioeconomic History  . Marital status: Married    Spouse name: Not on file  . Number of children: Not on file  . Years of education: Not on file  . Highest education level: Not on file  Social Needs  . Financial resource strain: Not on file  . Food insecurity - worry: Not on file  . Food insecurity - inability: Not on file  . Transportation needs - medical: Not on file  . Transportation needs - non-medical: Not on file  Occupational History  . Not on file  Tobacco Use  . Smoking status: Never Smoker  . Smokeless tobacco: Never Used  Substance and Sexual Activity  . Alcohol use: No  . Drug use: No  . Sexual activity: Not on file  Other Topics Concern  . Not on file  Social History Narrative  . Not on file    ALLERGIES:     No Known Allergies  CURRENT MEDICATIONS:    Current Facility-Administered Medications  Medication Dose Route Frequency Provider Last Rate Last Dose  . 0.9 %  sodium chloride infusion   Intravenous Continuous Nada LibmanBrabham, Demira Gwynne W, MD      . acetaminophen (TYLENOL) tablet 650 mg  650 mg Oral Q4H PRN Nada LibmanBrabham, Teylor Wolven W, MD      . alum & mag hydroxide-simeth (MAALOX/MYLANTA) 200-200-20 MG/5ML suspension 15-30 mL  15-30 mL Oral Q2H PRN Nada LibmanBrabham, Damarian Priola W, MD      . guaiFENesin-dextromethorphan (ROBITUSSIN DM) 100-10 MG/5ML syrup 15 mL  15 mL Oral Q4H PRN Nada LibmanBrabham, Geena Weinhold W, MD      . heparin bolus via infusion 6,888 Units  60 Units/kg Intravenous Once Fayrene Helperran, [place], PA-C      . hydrALAZINE (APRESOLINE) injection 5 mg  5 mg Intravenous Q20 Min PRN Nada LibmanBrabham, Yuktha Kerchner W, MD      . labetalol (NORMODYNE,TRANDATE) injection 10 mg  10 mg Intravenous Q10 min PRN Nada LibmanBrabham, Kazue Cerro W, MD      . metoprolol tartrate (LOPRESSOR) injection 2-5 mg  2-5 mg Intravenous Q2H PRN Nada LibmanBrabham, Lexxie Winberg W, MD      . ondansetron [facility](ZOFRAN) injection 4 mg  4 mg Intravenous Q6H PRN Nada LibmanBrabham, Terresa Marlett W, MD      .  ondansetron (ZOFRAN) injection 4 mg  4 mg Intravenous Q6H PRN Nada LibmanBrabham, Onnie Alatorre W, MD      . pantoprazole (PROTONIX) EC tablet 40 mg  40 mg Oral Daily Nada LibmanBrabham, Lowen Mansouri W, MD      . phenol (CHLORASEPTIC) mouth spray 1 spray  1 spray Mouth/Throat PRN Nada LibmanBrabham, Adamarys Shall W, MD      . potassium chloride SA (K-DUR,KLOR-CON) CR tablet 20-40 mEq  20-40 mEq Oral Once Nada LibmanBrabham, Annise Boran W, MD       Current Outpatient Medications  Medication Sig Dispense Refill  . HYDROcodone-acetaminophen (NORCO) 5-325 MG tablet 1-2 tabs po q6 hours prn pain (Patient not taking: Reported on 11/05/2017) 20 tablet 0  . HYDROcodone-acetaminophen (NORCO/VICODIN) 5-325 MG tablet Take 2 tablets by mouth every 4 (four) hours as needed. (Patient not taking: Reported on 11/05/2017) 16 tablet 0  . phenytoin (DILANTIN) 100 MG ER capsule Take 100 mg by mouth 3 (three) times daily.    .  silver sulfADIAZINE (SILVADENE) 1 % cream Apply 1 application topically daily. (Patient not taking: Reported on 11/05/2017) 50 g 0    REVIEW OF SYSTEMS:   [X]  denotes positive finding, [ ]  denotes negative finding Cardiac  Comments:  Chest pain or chest pressure:    Shortness of breath upon exertion:    Short of breath when lying flat:    Irregular heart rhythm:        Vascular    Pain in calf, thigh, or hip brought on by ambulation:    Pain in feet at night that wakes you up from your sleep:     Blood clot in your veins:    Leg swelling:  x       Pulmonary    Oxygen at home:    Productive cough:     Wheezing:         Neurologic    Sudden weakness in arms or legs:     Sudden numbness in arms or legs:     Sudden onset of difficulty speaking or slurred speech:    Temporary loss of vision in one eye:     Problems with dizziness:         Gastrointestinal    Blood in stool:      Vomited blood:         Genitourinary    Burning when urinating:     Blood in urine:        Psychiatric    Major depression:         Hematologic    Bleeding problems:    Problems with blood clotting too easily:        Skin    Rashes or ulcers: x       Constitutional    Fever or chills:     PHYSICAL EXAM:   Vitals:   11/06/17 0954 11/06/17 0955 11/06/17 1400  BP: 134/89  127/87  Pulse: 87  78  Resp: 18  16  Temp: 98.2 F (36.8 C)    TempSrc: Oral    SpO2: 100%  100%  Weight:  253 lb (114.8 kg)   Height:  6\' 2"  (1.88 m)     GENERAL: The patient is a well-nourished male, in no acute distress. The vital signs are documented above. CARDIAC: There is a regular rate and rhythm.  VASCULAR: Edematous right leg which is tender to palpation with evidence of healed ulcers. PULMONARY: Nonlabored respirations  MUSCULOSKELETAL: There are no major deformities or cyanosis. NEUROLOGIC: No focal weakness  or paresthesias are detected. SKIN: There are no ulcers or rashes noted. PSYCHIATRIC: The  patient has a normal affect.  STUDIES:   I have reviewed his venous ultrasound which shows clot extending up into the distal right external iliac vein, and the common femoral, femoral and popliteal vein.  ASSESSMENT and PLAN   Acute right lower extremity DVT: The patient will be admitted for IV heparin.  I did discuss with the patient the possibility of thrombolysis and mechanical thrombectomy.  We discussed the risks associated with the procedure including the risk of bleeding, intracranial hemorrhage, PE.  He is okay with proceeding.  He understands that this potentially could be associated with a overnight administration of lytic therapy, however hopefully be treated in a single session.  I will make him n.p.o. after midnight for procedure tomorrow.  He will be hydrated overnight for possible thrombolysis.   Durene Cal, MD [facility] [phone] Pager [phone]

## 2017-11-07 ENCOUNTER — Encounter (HOSPITAL_COMMUNITY)
Admission: EM | Disposition: A | Discharge status: Home / Self Care | Payer: Self-pay | Source: Home / Self Care | Attending: Surgery

## 2017-11-07 ENCOUNTER — Ambulatory Visit: Payer: BLUE CROSS/BLUE SHIELD | Admitting: Vascular Surgery

## 2017-11-07 DIAGNOSIS — I824Y9 Acute embolism and thrombosis of unspecified deep veins of unspecified proximal lower extremity: Secondary | ICD-10-CM | POA: Diagnosis present

## 2017-11-07 HISTORY — PX: LOWER EXTREMITY VENOGRAPHY: CATH118253

## 2017-11-07 LAB — CBC
HCT: 37.6 % — ABNORMAL LOW (ref 39.0–52.0)
HEMATOCRIT: 37.6 % — AB (ref 39.0–52.0)
HEMATOCRIT: 35.1 % — AB (ref 39.0–52.0)
HEMOGLOBIN: 12.4 g/dL — AB (ref 13.0–17.0)
HEMOGLOBIN: 12.5 g/dL — AB (ref 13.0–17.0)
Hemoglobin: 11.6 g/dL — ABNORMAL LOW (ref 13.0–17.0)
MCH: 27.5 pg (ref 26.0–34.0)
MCH: 27.7 pg (ref 26.0–34.0)
MCH: 27.6 pg (ref 26.0–34.0)
MCHC: 33 g/dL (ref 30.0–36.0)
MCHC: 33.2 g/dL (ref 30.0–36.0)
MCHC: 33 g/dL (ref 30.0–36.0)
MCV: 83.4 fL (ref 78.0–100.0)
MCV: 83.2 fL (ref 78.0–100.0)
MCV: 83.4 fL (ref 78.0–100.0)
Platelets: 215 10*3/uL (ref 150–400)
Platelets: 198 10*3/uL (ref 150–400)
Platelets: 180 10*3/uL (ref 150–400)
RBC: 4.51 MIL/uL (ref 4.22–5.81)
RBC: 4.52 MIL/uL (ref 4.22–5.81)
RBC: 4.21 MIL/uL — ABNORMAL LOW (ref 4.22–5.81)
RDW: 15.1 % (ref 11.5–15.5)
RDW: 15 % (ref 11.5–15.5)
RDW: 15.2 % (ref 11.5–15.5)
WBC: 8.3 10*3/uL (ref 4.0–10.5)
WBC: 9.6 10*3/uL (ref 4.0–10.5)
WBC: 9.4 10*3/uL (ref 4.0–10.5)

## 2017-11-07 LAB — HIV ANTIBODY (ROUTINE TESTING W REFLEX): HIV SCREEN 4TH GENERATION: NONREACTIVE

## 2017-11-07 LAB — COMPREHENSIVE METABOLIC PANEL
ALBUMIN: 3 g/dL — AB (ref 3.5–5.0)
ALK PHOS: 120 U/L (ref 38–126)
ALT: 18 U/L (ref 17–63)
ANION GAP: 7 (ref 5–15)
AST: 22 U/L (ref 15–41)
BUN: 11 mg/dL (ref 6–20)
CALCIUM: 8.3 mg/dL — AB (ref 8.9–10.3)
CHLORIDE: 107 mmol/L (ref 101–111)
CO2: 24 mmol/L (ref 22–32)
CREATININE: 0.72 mg/dL (ref 0.61–1.24)
GFR calc Af Amer: >60 mL/min (ref >60)
GFR calc non Af Amer: >60 mL/min (ref >60)
GLUCOSE: 112 mg/dL — AB (ref 65–99)
Potassium: 3.8 mmol/L (ref 3.5–5.1)
SODIUM: 138 mmol/L (ref 135–145)
Total Bilirubin: 0.5 mg/dL (ref 0.3–1.2)
Total Protein: 7.2 g/dL (ref 6.5–8.1)

## 2017-11-07 LAB — HEPARIN LEVEL (UNFRACTIONATED)
Heparin Unfractionated: 1.07 IU/mL — ABNORMAL HIGH (ref 0.30–0.70)
Heparin Unfractionated: 0.32 IU/mL (ref 0.30–0.70)
Heparin Unfractionated: <0.1 IU/mL — ABNORMAL LOW (ref 0.30–0.70)

## 2017-11-07 LAB — FIBRINOGEN
Fibrinogen: 450 mg/dL (ref 210–475)
Fibrinogen: 144 mg/dL — ABNORMAL LOW (ref 210–475)

## 2017-11-07 SURGERY — LOWER EXTREMITY VENOGRAPHY
Anesthesia: LOCAL | Laterality: Right

## 2017-11-07 MED ORDER — HEPARIN (PORCINE) IN NACL 100-0.45 UNIT/ML-% IJ SOLN
1600.0000 [IU]/h | INTRAMUSCULAR | Status: DC
  Filled 2017-11-07 (×2): qty 250

## 2017-11-07 MED ORDER — IODIXANOL 320 MG/ML IV SOLN
INTRAVENOUS | Status: DC | PRN
  Administered 2017-11-07: 10 mL via INTRAVENOUS

## 2017-11-07 MED ORDER — LIDOCAINE HCL (PF) 1 % IJ SOLN
INTRAMUSCULAR | Status: DC | PRN
Start: 2017-11-07 — End: 2017-11-07
  Administered 2017-11-07: 35 mL

## 2017-11-07 MED ORDER — LIDOCAINE HCL (PF) 1 % IJ SOLN
INTRAMUSCULAR | Status: AC
  Filled 2017-11-07: qty 30

## 2017-11-07 MED ORDER — SODIUM CHLORIDE 0.9 % IV SOLN
0.5000 mg/h | INTRAVENOUS | Status: DC
  Administered 2017-11-08: 0.1 mg/h
  Filled 2017-11-07 (×5): qty 10

## 2017-11-07 MED ORDER — ALTEPLASE 2 MG IJ SOLR
INTRAMUSCULAR | Status: AC | PRN
  Administered 2017-11-07: 1 mg/h

## 2017-11-07 MED ORDER — HEPARIN (PORCINE) IN NACL 100-0.45 UNIT/ML-% IJ SOLN
500.0000 [IU]/h | INTRAMUSCULAR | Status: DC
  Administered 2017-11-08: 500 [IU]/h via INTRAVENOUS
  Filled 2017-11-07: qty 250

## 2017-11-07 MED ORDER — SODIUM CHLORIDE 0.9% FLUSH: 3.0000 mL | INTRAVENOUS | Status: DC | PRN

## 2017-11-07 MED ORDER — SODIUM CHLORIDE 0.9 % IV SOLN
INTRAVENOUS | Status: DC
  Administered 2017-11-08: 11:00:00 via INTRAVENOUS

## 2017-11-07 MED ORDER — HEPARIN (PORCINE) IN NACL 2-0.9 UNIT/ML-% IJ SOLN
INTRAMUSCULAR | Status: AC | PRN
  Administered 2017-11-07: 500 mL

## 2017-11-07 MED ORDER — SODIUM CHLORIDE 0.9% FLUSH
3.0000 mL | Freq: Two times a day (BID) | INTRAVENOUS | Status: DC
  Administered 2017-11-08 – 2017-11-09 (×2): 3 mL via INTRAVENOUS

## 2017-11-07 MED ORDER — HEPARIN (PORCINE) IN NACL 2-0.9 UNIT/ML-% IJ SOLN
INTRAMUSCULAR | Status: AC
  Filled 2017-11-07: qty 500

## 2017-11-07 MED ORDER — SODIUM CHLORIDE 0.9 % IV SOLN: 250.0000 mL | INTRAVENOUS | Status: DC | PRN

## 2017-11-07 MED ORDER — SODIUM CHLORIDE 0.9 % IV SOLN: 1.0000 mg/h | INTRAVENOUS | Status: DC

## 2017-11-07 MED ORDER — ONDANSETRON HCL 4 MG/2ML IJ SOLN: 4.0000 mg | Freq: Four times a day (QID) | INTRAMUSCULAR | Status: DC | PRN

## 2017-11-07 SURGICAL SUPPLY — 13 items
CATH INFUS 135CMX50CM (CATHETERS) ×2
CATH TEMPO AQUA 5F 100CM (CATHETERS) ×2
CATH VISIONS PV .035 IVUS (CATHETERS) ×4
COVER PRB 48X5XTLSCP FOLD TPE (BAG) ×1
COVER PROBE 5X48 (BAG) ×2
KIT MICROINTRODUCER STIFF 5F (SHEATH) ×2
KIT PV (KITS) ×2
SHEATH PINNACLE 8F 10CM (SHEATH) ×2
TRAY PV CATH (CUSTOM PROCEDURE TRAY) ×2
WIRE BENTSON .035X145CM (WIRE) ×2
WIRE HI TORQ VERSACORE J 260CM (WIRE) ×2
WIRE MICROINTRODUCER 60CM (WIRE) ×2
WIRE TORQFLEX AUST .018X40CM (WIRE) ×12

## 2017-11-07 NOTE — Op Note (Signed)
OPERATIVE NOTE   PROCEDURE: 1.  Right popliteal vein cannulation under ultrasound 2.  Third order venous selection 3.  Placement of catheter in inferior vena cava  4   Inferior vena cavogram 5.  Intravascular ultrasound of right popliteal, femoral, common femoral, external iliac and common iliac vein and inferior vena cava  PRE-OPERATIVE DIAGNOSIS: right iliofemoral deep vein thrombosis   POST-OPERATIVE DIAGNOSIS: same as above   SURGEON: Leonides SakeBrian Chen, MD  ANESTHESIA: conscious sedation  ESTIMATED BLOOD LOSS: 50 cc  CONTRAST: 5 cc  FINDING(S): 1.  Scattered pockets of thrombus from popliteal vein up to common femoral vein: some non-occlusive, some occlusive 2.  Occlusion of common femoral vein and possible proximal profunda vein: suboptimal visualization of take off, more distal profunda femoral vein is patent 3.  Thrombus extended into distal external iliac vein with majority of external iliac vein patent 4.  Patent common iliac vein with proximal segment stenosis without associated thrombosis: unclear if underfilling 5.  Patent inferior vena cava  SPECIMEN(S):  none  INDICATIONS:   Patrick Ingram is a 51 y.o. male who presents with right leg swelling found to be iliofemoral deep vein thrombosis in the setting of greater saphenous vein thrombosis.  The patient presents for: placement of thrombolytic catheter in right venous system.  I discussed with the patient the nature of angiographic procedures, especially the limited patencies of any endovascular intervention.  The patient is aware of that the risks of an angiographic procedure include but are not limited to: bleeding, infection, access site complications, renal failure, embolization, rupture of vessel, dissection, possible need for emergent surgical intervention, possible need for surgical procedures to treat the patient's pathology, and stroke and death.  The risks of bleeding related to thrombolysis were discussed with the  patient.  The patient is aware of the risks and agrees to proceed.  DESCRIPTION: After full informed consent was obtained from the patient, the patient was brought back to the angiography suite.  The patient was placed supine upon the angiography table and connected to cardiopulmonary monitoring equipment.  A circulating radiologic technician maintained continuous monitoring of the patient's cardiopulmonary status.  Additionally, the control room radiologic technician provided backup monitoring throughout the procedure.  The patient was positioned in prone position on the table and then prepped and drape in the standard fashion for an angiographic procedure.  At this point, attention was turned to the right popliteal fossa.  I easily identified the popliteal artery and veins.  I injected a total of 20 cc of 1% lidocaine without epinephrine to obtain a field block of this popliteal fossa.  I cannulated the lateral popliteal vein under Sonosite guidance, but the wire would not easily advanced, encountered a hard resistance.  I pulled the wire and held pressure for 1-2 minutes.  I then tried to cannulate the vein from a different angle.  This time I inadvertently cannulated the popliteal artery.  I removed the needle and held pressure for 5 minutes.  I reinterrogated the popliteal fossa and found no hematoma, so I felt it was safe to proceed.  I cannulated the medial popliteal vein and encountered resistance to the needle after ~10 cm.  There was return of venous flow, so I suspect there was a valve blocking the wire progress.  I removed the wire and needle and held pressure for 1-2 minutes.  I then cannulated the medial popliteal vein from different angles another couple of times.  Both times the wire would not pass proximally.  I then tried to cannulate the posterior tibial vein draining into the lateral popliteal vein.  This time I advanced the microwire into the femoral vein.  I exchanged the needle for a  microsheath.  I removed the wire and dilator.  I passed a Bentson wire into femoral vein.  I exchanged the microsheath for a 8-Fr sheath.  I loaded a straight catheter over the wire.  Using this combination, I was able to cross through the femoral vein, common femoral vein, external iliac vein, common iliac vein, and finally into the inferior vena cava.  I did a hand injection which demonstrated a widely patent inferior vena cava.  I exchanged the wire for a Versacore wire which was advanced into the inferior vena cava.  I removed the catheter.  I loaded a intravenous ultrasound catheter over the wire.  I interrogated the vein from the level of the sheath into the inferior vena cava.  I pulled the catheter from the inferior vena cava back into the sheath.  The findings are listed above.  I readvanced the IVUS into the common femoral vein and distal external iliac vein.  I identified the proximal extent of the thrombus.  I loaded a Unifuse thrombolytic catheter over the wire and positioned the tip of the thrombolytic catheter in the most proximal extent thrombus.  The wire was removed and then the central cannula was inserted into the thrombolytic catheter.  The position of the thrombolytic catheter were secured in place relative to the sheath.  The central cannula was connected to the tpa drip (1 mg/hr).  The sheath side port was aspirated and flushed with heparinized saline.  Heparin drip at 500 U/hr was connected to the side port.  The sheath was secure in place with a 3-0 silk suture.  Sterile dressing was applied and secured with an Opsite.    Will plan on rechecking the thrombolysis tomorrow afternoon.  Patient was transferred to surgical intensive care unit.    COMPLICATIONS: none  CONDITION: stable   Leonides SakeBrian Chen, MD, [facility] [place] Office: [phone] Pager: [phone]  11/07/2017, 2:09 PM

## 2017-11-07 NOTE — Interval H&P Note (Signed)
   History and Physical Update  The patient was interviewed and re-examined.  The patient's previous History and Physical has been reviewed and is unchanged from Dr. Estanislado SpireBrabham's consult.  Per discussions with Dr. Myra GianottiBrabham and Dr. Randie Heinzain, the plan is:  Left leg and central venography, Placement of thrombolytic catheter.  Tomorrow Dr. Randie Heinzain will perform  repeat venography tomorrow, possible mechanical thrombolysis, and possible venous stenting tomorrow.   I discussed with the patient the nature of angiographic procedures, especially the limited patencies of any endovascular intervention.    The patient is aware of that the risks of an angiographic procedure include but are not limited to: bleeding, infection, access site complications, renal failure, embolization, rupture of vessel, dissection, arteriovenous fistula, possible need for emergent surgical intervention, possible need for surgical procedures to treat the patient's pathology, anaphylactic reaction to contrast, and stroke and death.    The risks of system bleeding from the thrombolysis and anticoagulation include but are not limited to: intracranial hemorrhage, GI bleeding, and other systemic bleeding.    The patient is aware of the risks and agrees to proceed.    Leonides SakeBrian Chen, MD, FACS [facility] [place] Office: [phone] Pager: [phone]  11/07/2017, 11:41 AM

## 2017-11-07 NOTE — Progress Notes (Signed)
ANTICOAGULATION CONSULT NOTE  Pharmacy Consult for heparin Indication: DVT  Heparin Dosing Weight: 106.4 kg  Assessment: 51 yom admitted with acute RLE DVT. Hx of chronic DVT in L leg. Pharmacy consulted to dose heparin.   Heparin level supratherapeutic at 1.07 and drawn in arm opposite from heparin gtt per RN. Hgb down slightly and plts stable. No s/s bleeding documented.   Goal of Therapy:  Heparin level 0.3-0.7 units/ml Monitor platelets by anticoagulation protocol: Yes   Plan:  Hold heparin gtt x1 hr and resume at 1600 units/hr Heparin level 6 hrs after gtt resumed Daily heparin level and CBC Monitor for s/s bleeding   Einar CrowKatherine Weigle, PharmD Clinical Pharmacist 11/07/17 5:22 AM

## 2017-11-08 ENCOUNTER — Encounter (HOSPITAL_COMMUNITY)
Admission: EM | Disposition: A | Discharge status: Home / Self Care | Payer: Self-pay | Source: Home / Self Care | Attending: Surgery

## 2017-11-08 ENCOUNTER — Encounter (HOSPITAL_COMMUNITY): Payer: Self-pay | Admitting: Vascular Surgery

## 2017-11-08 HISTORY — PX: PERIPHERAL VASCULAR BALLOON ANGIOPLASTY: CATH118281

## 2017-11-08 HISTORY — PX: LOWER EXTREMITY ANGIOGRAPHY: CATH118251

## 2017-11-08 LAB — CBC
HCT: 35.5 % — ABNORMAL LOW (ref 39.0–52.0)
HEMATOCRIT: 35.4 % — AB (ref 39.0–52.0)
HEMOGLOBIN: 11.7 g/dL — AB (ref 13.0–17.0)
Hemoglobin: 11.9 g/dL — ABNORMAL LOW (ref 13.0–17.0)
MCH: 27.2 pg (ref 26.0–34.0)
MCH: 27.4 pg (ref 26.0–34.0)
MCHC: 33.1 g/dL (ref 30.0–36.0)
MCHC: 33.5 g/dL (ref 30.0–36.0)
MCV: 82.3 fL (ref 78.0–100.0)
MCV: 81.8 fL (ref 78.0–100.0)
PLATELETS: 172 10*3/uL (ref 150–400)
PLATELETS: 168 10*3/uL (ref 150–400)
RBC: 4.3 MIL/uL (ref 4.22–5.81)
RBC: 4.34 MIL/uL (ref 4.22–5.81)
RDW: 15 % (ref 11.5–15.5)
RDW: 14.9 % (ref 11.5–15.5)
WBC: 7.8 10*3/uL (ref 4.0–10.5)
WBC: 8.7 10*3/uL (ref 4.0–10.5)

## 2017-11-08 LAB — BASIC METABOLIC PANEL
Anion gap: 5 (ref 5–15)
BUN: 9 mg/dL (ref 6–20)
CALCIUM: 8.2 mg/dL — AB (ref 8.9–10.3)
CHLORIDE: 107 mmol/L (ref 101–111)
CO2: 25 mmol/L (ref 22–32)
CREATININE: 0.78 mg/dL (ref 0.61–1.24)
GFR calc Af Amer: >60 mL/min (ref >60)
Glucose, Bld: 101 mg/dL — ABNORMAL HIGH (ref 65–99)
Potassium: 4 mmol/L (ref 3.5–5.1)
SODIUM: 137 mmol/L (ref 135–145)

## 2017-11-08 LAB — POCT ACTIVATED CLOTTING TIME
ACTIVATED CLOTTING TIME: 98 s
ACTIVATED CLOTTING TIME: 235 s
ACTIVATED CLOTTING TIME: 191 s
Activated Clotting Time: 268 seconds

## 2017-11-08 LAB — FIBRINOGEN
FIBRINOGEN: 128 mg/dL — AB (ref 210–475)
FIBRINOGEN: 143 mg/dL — AB (ref 210–475)
Fibrinogen: 128 mg/dL — ABNORMAL LOW (ref 210–475)

## 2017-11-08 LAB — HEPARIN LEVEL (UNFRACTIONATED)
HEPARIN UNFRACTIONATED: 0.27 [IU]/mL — AB (ref 0.30–0.70)
Heparin Unfractionated: <0.1 IU/mL — ABNORMAL LOW (ref 0.30–0.70)

## 2017-11-08 SURGERY — LOWER EXTREMITY ANGIOGRAPHY
Anesthesia: LOCAL | Laterality: Right

## 2017-11-08 MED ORDER — ENOXAPARIN SODIUM 120 MG/0.8ML ~~LOC~~ SOLN
120.0000 mg | Freq: Two times a day (BID) | SUBCUTANEOUS | Status: DC
  Administered 2017-11-08 – 2017-11-09 (×2): 120 mg via SUBCUTANEOUS
  Filled 2017-11-08 (×2): qty 0.8

## 2017-11-08 MED ORDER — HEPARIN (PORCINE) IN NACL 2-0.9 UNIT/ML-% IJ SOLN
INTRAMUSCULAR | Status: AC | PRN
  Administered 2017-11-08: 1500 mL

## 2017-11-08 MED ORDER — SODIUM CHLORIDE 0.9% FLUSH: 3.0000 mL | INTRAVENOUS | Status: DC | PRN

## 2017-11-08 MED ORDER — MORPHINE SULFATE (PF) 4 MG/ML IV SOLN: 2.0000 mg | INTRAVENOUS | Status: DC | PRN

## 2017-11-08 MED ORDER — WARFARIN - PHARMACIST DOSING INPATIENT: Freq: Every day | Status: DC

## 2017-11-08 MED ORDER — HEPARIN (PORCINE) IN NACL 2-0.9 UNIT/ML-% IJ SOLN
INTRAMUSCULAR | Status: AC
  Filled 2017-11-08: qty 500

## 2017-11-08 MED ORDER — SODIUM CHLORIDE 0.9 % IV SOLN: 250.0000 mL | INTRAVENOUS | Status: DC | PRN

## 2017-11-08 MED ORDER — POTASSIUM CHLORIDE CRYS ER 20 MEQ PO TBCR: 20.0000 meq | EXTENDED_RELEASE_TABLET | Freq: Every day | ORAL | Status: DC | PRN

## 2017-11-08 MED ORDER — HEPARIN SODIUM (PORCINE) 1000 UNIT/ML IJ SOLN
INTRAMUSCULAR | Status: AC
  Filled 2017-11-08: qty 1

## 2017-11-08 MED ORDER — SODIUM CHLORIDE 0.9% FLUSH: 3.0000 mL | Freq: Two times a day (BID) | INTRAVENOUS | Status: DC

## 2017-11-08 MED ORDER — HEPARIN SODIUM (PORCINE) 1000 UNIT/ML IJ SOLN
INTRAMUSCULAR | Status: DC | PRN
  Administered 2017-11-08: 12000 [IU] via INTRAVENOUS

## 2017-11-08 MED ORDER — RIVAROXABAN (XARELTO) EDUCATION KIT FOR DVT/PE PATIENTS: PACK | Freq: Once | Status: DC

## 2017-11-08 MED ORDER — OXYCODONE HCL 5 MG PO TABS
5.0000 mg | ORAL_TABLET | ORAL | Status: DC | PRN
  Administered 2017-11-08: 10 mg via ORAL
  Filled 2017-11-08: qty 2

## 2017-11-08 MED ORDER — COUMADIN BOOK
Freq: Once | Status: DC
  Filled 2017-11-08: qty 1

## 2017-11-08 MED ORDER — HEPARIN (PORCINE) IN NACL 100-0.45 UNIT/ML-% IJ SOLN
1100.0000 [IU]/h | INTRAMUSCULAR | Status: DC
  Administered 2017-11-08: 1100 [IU]/h via INTRAVENOUS

## 2017-11-08 MED ORDER — WARFARIN SODIUM 5 MG PO TABS
10.0000 mg | ORAL_TABLET | Freq: Once | ORAL | Status: AC
  Administered 2017-11-08: 10 mg via ORAL
  Filled 2017-11-08: qty 2
  Filled 2017-11-08: qty 1

## 2017-11-08 MED ORDER — MAGNESIUM SULFATE 2 GM/50ML IV SOLN
2.0000 g | Freq: Every day | INTRAVENOUS | Status: DC | PRN
  Filled 2017-11-08: qty 50

## 2017-11-08 MED ORDER — SODIUM CHLORIDE 0.9 % IV SOLN
INTRAVENOUS | Status: DC
  Administered 2017-11-08: 05:00:00 via INTRAVENOUS

## 2017-11-08 MED ORDER — ENOXAPARIN (LOVENOX) PATIENT EDUCATION KIT
PACK | Freq: Once | Status: AC
  Administered 2017-11-09
  Filled 2017-11-08: qty 1

## 2017-11-08 MED ORDER — SODIUM CHLORIDE 0.9 % WEIGHT BASED INFUSION
1.0000 mL/kg/h | INTRAVENOUS | Status: AC
  Administered 2017-11-08: 23:00:00 1 mL/kg/h via INTRAVENOUS

## 2017-11-08 MED ORDER — IODIXANOL 320 MG/ML IV SOLN
INTRAVENOUS | Status: DC | PRN
  Administered 2017-11-08: 30 mL via INTRAVENOUS

## 2017-11-08 MED FILL — Heparin Sodium (Porcine) 2 Unit/ML in Sodium Chloride 0.9%: INTRAMUSCULAR | Qty: 500 | Status: AC

## 2017-11-08 SURGICAL SUPPLY — 13 items
BAG SNAP BAND KOVER 36X36 (MISCELLANEOUS) ×1 IMPLANT
BALLN MUSTANG 10X80X75 (BALLOONS) ×6
BALLN MUSTANG 12.0X40 75 (BALLOONS) ×3
BALLOON MUSTANG 10X80X75 (BALLOONS) IMPLANT
BALLOON MUSTANG 12.0X40 75 (BALLOONS) IMPLANT
CATH VISIONS PV .035 IVUS (CATHETERS) ×1 IMPLANT
COVER DOME SNAP 22 D (MISCELLANEOUS) ×1 IMPLANT
KIT ENCORE 26 ADVANTAGE (KITS) ×3 IMPLANT
PROTECTION STATION PRESSURIZED (MISCELLANEOUS) ×3
SET ZELANTE DVT THROMB (CATHETERS) ×1 IMPLANT
STATION PROTECTION PRESSURIZED (MISCELLANEOUS) IMPLANT
TRAY PV CATH (CUSTOM PROCEDURE TRAY) ×3 IMPLANT
WIRE HI TORQ VERSACORE J 260CM (WIRE) ×1 IMPLANT

## 2017-11-08 NOTE — Interval H&P Note (Signed)
   History and Physical Update  The patient was interviewed and re-examined.  The patient's previous History and Physical has been reviewed and is unchanged from Dr, Myra GianottiBrabham consult except for: interval placement of thrombolytic catheter.  There is no change in the plan of care: left leg and central venography and possible rheolytic thrombectomy.  - risks are unchanged from yesterday   Leonides SakeBrian Indria Bishara, MD, FACS [facility] [place] Office: [phone] Pager: [phone]  11/08/2017, 2:01 PM

## 2017-11-08 NOTE — Progress Notes (Signed)
ANTICOAGULATION CONSULT NOTE - Initial Consult  Pharmacy Consult for heparin Indication: DVT now s/p thrombolysis  No Known Allergies  Patient Measurements: Height: 6\' 2"  (188 cm) Weight: 254 lb 3.1 oz (115.3 kg) IBW/kg (Calculated) : 82.2 Heparin Dosing Weight: 105kg  Vital Signs: Temp: 98.6 F (37 C) (11/16 0750) Temp Source: Oral (11/16 0750) BP: 120/82 (11/16 1200) Pulse Rate: 82 (11/16 1230)  Labs: Recent Labs    11/06/17 1246 11/06/17 1303 11/06/17 1434  11/07/17 0340  11/07/17 2135 11/08/17 0314 11/08/17 0345 11/08/17 0922  HGB 13.1 13.9  --   --  12.4*   < > 11.6* 11.7*  --  11.9*  HCT 39.7 41.0  --   --  37.6*   < > 35.1* 35.4*  --  35.5*  PLT 208  --   --   --  215   < > 180 172  --  168  LABPROT 12.9  --  12.9  --   --   --   --   --   --   --   INR 0.98  --  0.98  --   --   --   --   --   --   --   HEPARINUNFRC  --   --   --    < > 1.07*   < > <0.10* <0.10*  --  0.27*  CREATININE  --  0.70  --   --  0.72  --   --   --  0.78  --    < > = values in this interval not displayed.    Estimated Creatinine Clearance: 144.1 mL/min (by C-G formula based on SCr of 0.78 mg/dL).   Medical History: Past Medical History:  Diagnosis Date  . Ankle ulcer (HCC)    "I've had them on both sides" (11/06/2017)  . DVT (deep venous thrombosis) (HCC) 10/30/2017   RLE  . Grand mal seizure [facility](HCC)    LAST SEIZURE 12/25/1991; on daily RX since" (11/06/2017)  . Varicose veins     Medications:  Medications Prior to Admission  Medication Sig Dispense Refill Last Dose  . ibuprofen (ADVIL,MOTRIN) 200 MG tablet Take 200 mg every 6 (six) hours as needed by mouth for moderate pain.   Past Week at Unknown time  . phenytoin (DILANTIN) 100 MG ER capsule Take 400 mg daily by mouth.    11/06/2017 at Unknown time  . HYDROcodone-acetaminophen (NORCO) 5-325 MG tablet 1-2 tabs po q6 hours prn pain (Patient not taking: Reported on 11/05/2017) 20 tablet 0 Not Taking  . HYDROcodone-acetaminophen  (NORCO/VICODIN) 5-325 MG tablet Take 2 tablets by mouth every 4 (four) hours as needed. (Patient not taking: Reported on 11/05/2017) 16 tablet 0 Completed Course at Unknown time  . silver sulfADIAZINE (SILVADENE) 1 % cream Apply 1 application topically daily. (Patient not taking: Reported on 11/05/2017) 50 g 0 Not Taking   Scheduled:  . pantoprazole  40 mg Oral Daily  . phenytoin  400 mg Oral Daily  . potassium chloride  20-40 mEq Oral Once  . sodium chloride flush  3 mL Intravenous Q12H   Infusions:  . sodium chloride 75 mL/hr at 11/08/17 1200  . sodium chloride    . sodium chloride 10 mL/hr at 11/08/17 1200  . heparin 500 Units/hr (11/08/17 1200)  . heparin 1,100 Units/hr (11/08/17 1200)    Assessment: 51yo male had been started on IV heparin for DVT then sent to IR for thrombolysis. TPA stopped due  to low fibrogen and heparin continues as 1100 units/hr. Plans for angiogram today -heparin level is 0.27 and below goal but was collected early   Goal of Therapy:  Heparin level 0.3-0.7 units/ml Monitor platelets by anticoagulation protocol: Yes   Plan:  -No heparin changes now as heparin level may continue to trend up -Will follow plans post procedure  Harland Germanndrew Dontay Harm, Pharm D 11/08/2017 1:19 PM

## 2017-11-08 NOTE — Progress Notes (Addendum)
ANTICOAGULATION CONSULT NOTE - Follow Up Consult  Pharmacy Consult for Lovenox >> Coumadin Indication: DVT s/p lytic therapy  No Known Allergies  Patient Measurements: Height: 6' 2"  (188 cm) Weight: 254 lb 3.1 oz (115.3 kg) IBW/kg (Calculated) : 82.2  Vital Signs: Temp: 98.6 F (37 C) (11/16 0750) Temp Source: Oral (11/16 0750) BP: 125/83 (11/16 1635) Pulse Rate: 81 (11/16 1635)  Labs: Recent Labs    11/06/17 1246 11/06/17 1303 11/06/17 1434  11/07/17 0340  11/07/17 2135 11/08/17 0314 11/08/17 0345 11/08/17 0922  HGB 13.1 13.9  --   --  12.4*   < > 11.6* 11.7*  --  11.9*  HCT 39.7 41.0  --   --  37.6*   < > 35.1* 35.4*  --  35.5*  PLT 208  --   --   --  215   < > 180 172  --  168  LABPROT 12.9  --  12.9  --   --   --   --   --   --   --   INR 0.98  --  0.98  --   --   --   --   --   --   --   HEPARINUNFRC  --   --   --    < > 1.07*   < > <0.10* <0.10*  --  0.27*  CREATININE  --  0.70  --   --  0.72  --   --   --  0.78  --    < > = values in this interval not displayed.    Estimated Creatinine Clearance: 144.1 mL/min (by C-G formula based on SCr of 0.78 mg/dL).   Medications:  Scheduled:  . [MAR Hold] pantoprazole  40 mg Oral Daily  . [MAR Hold] phenytoin  400 mg Oral Daily  . [MAR Hold] potassium chloride  20-40 mEq Oral Once  . [MAR Hold] sodium chloride flush  3 mL Intravenous Q12H   Infusions:  . sodium chloride 75 mL/hr at 11/08/17 1400  . [MAR Hold] sodium chloride    . sodium chloride 10 mL/hr at 11/08/17 1400  . sodium chloride    . heparin Stopped (11/08/17 1419)  . heparin Stopped (11/08/17 1400)    Assessment: [name] s/p lytic therapy (off 11/16 at 0500).  Pt s/p venogram this afternoon showing some residual clot but no occlusive disease.  Per orders, plan to start Lovenox >> Coumadin in anticipation of discharge in the next 24 hours.  Of note, patient should not be transitioned to alternative oral anticoagulation (Xarelto or  Eliquis) given drug interaction with phenytoin.  Goal of Therapy:  INR 2-3 Anti-Xa level 0.6-1 units/ml 4hrs after LMWH dose given Monitor platelets by anticoagulation protocol: Yes   Plan:  Lovenox 173m SQ q12h >> Patient needs a minimum of 10 syringes (5 days worth) at discharge but would prefer Rx for 14 syringes (7 days worth) just to be sure.  Coumadin 157mPO x 1 tonight Daily INR Lovenox kit Coumadin education book Will place CM consult for affordability of Lovenox at discharge.  KiManpower IncPharm.D., BCPS Clinical Pharmacist 11/08/2017 5:08 PM

## 2017-11-08 NOTE — Progress Notes (Signed)
ANTICOAGULATION CONSULT NOTE - Initial Consult  Pharmacy Consult for heparin Indication: DVT now s/p thrombolysis  No Known Allergies  Patient Measurements: Height: 6\' 2"  (188 cm) Weight: 254 lb 3.1 oz (115.3 kg) IBW/kg (Calculated) : 82.2 Heparin Dosing Weight: 105kg  Vital Signs: Temp: 98.6 F (37 C) (11/16 0401) Temp Source: Oral (11/16 0401) BP: 130/81 (11/16 0400) Pulse Rate: 80 (11/16 0400)  Labs: Recent Labs    11/06/17 1246 11/06/17 1303 11/06/17 1434  11/07/17 0340 11/07/17 1518 11/07/17 2135 11/08/17 0314 11/08/17 0345  HGB 13.1 13.9  --   --  12.4* 12.5* 11.6* 11.7*  --   HCT 39.7 41.0  --   --  37.6* 37.6* 35.1* 35.4*  --   PLT 208  --   --   --  215 198 180 172  --   LABPROT 12.9  --  12.9  --   --   --   --   --   --   INR 0.98  --  0.98  --   --   --   --   --   --   HEPARINUNFRC  --   --   --    < > 1.07* 0.32 <0.10* <0.10*  --   CREATININE  --  0.70  --   --  0.72  --   --   --  0.78   < > = values in this interval not displayed.    Estimated Creatinine Clearance: 144.1 mL/min (by C-G formula based on SCr of 0.78 mg/dL).   Medical History: Past Medical History:  Diagnosis Date  . Ankle ulcer (HCC)    "I've had them on both sides" (11/06/2017)  . DVT (deep venous thrombosis) (HCC) 10/30/2017   RLE  . Grand mal seizure [facility](HCC)    LAST SEIZURE 12/25/1991; on daily RX since" (11/06/2017)  . Varicose veins     Medications:  Medications Prior to Admission  Medication Sig Dispense Refill Last Dose  . ibuprofen (ADVIL,MOTRIN) 200 MG tablet Take 200 mg every 6 (six) hours as needed by mouth for moderate pain.   Past Week at Unknown time  . phenytoin (DILANTIN) 100 MG ER capsule Take 400 mg daily by mouth.    11/06/2017 at Unknown time  . HYDROcodone-acetaminophen (NORCO) 5-325 MG tablet 1-2 tabs po q6 hours prn pain (Patient not taking: Reported on 11/05/2017) 20 tablet 0 Not Taking  . HYDROcodone-acetaminophen (NORCO/VICODIN) 5-325 MG tablet Take 2  tablets by mouth every 4 (four) hours as needed. (Patient not taking: Reported on 11/05/2017) 16 tablet 0 Completed Course at Unknown time  . silver sulfADIAZINE (SILVADENE) 1 % cream Apply 1 application topically daily. (Patient not taking: Reported on 11/05/2017) 50 g 0 Not Taking   Scheduled:  . pantoprazole  40 mg Oral Daily  . phenytoin  400 mg Oral Daily  . potassium chloride  20-40 mEq Oral Once  . sodium chloride flush  3 mL Intravenous Q12H   Infusions:  . sodium chloride 75 mL/hr at 11/08/17 0400  . sodium chloride    . sodium chloride    . heparin 500 Units/hr (11/08/17 0400)    Assessment: 51yo male had been started on IV heparin for DVT then sent to IR for thrombolysis, now w/ fibrinogen 128, to stop alteplase and start systemic heparin; of note the heparin running through the sheath will continue, plan to return to IR this afternoon.  Goal of Therapy:  Heparin level 0.3-0.7 units/ml Monitor platelets  by anticoagulation protocol: Yes   Plan:  Will start heparin gtt IV at 1100 units/hr and continue heparin 500 units/hr via sheath and monitor heparin levels and CBC.  Vernard GamblesVeronda Jahmani Staup, PharmD, BCPS  11/08/2017,5:05 AM

## 2017-11-08 NOTE — Op Note (Signed)
 OPERATIVE NOTE   PROCEDURE: 1.  Left leg venogram 2.  inferior vena cavagram 3.  Intravascular ultrasound right popliteal, femoral, common femoral, external iliac and common iliac vein and inferior vena cava 4.  Rheolytic thrombectomy of right popliteal, femoral, common femoral, and external iliac vein (143 sec) 5.  Venoplasty of right common femoral vein x 2 (10 mm x 80 mm, 12 mm x 40 mm) 6.  Venoplasty of right femoral and popliteal vein (10 mm x 80 mm)  PRE-OPERATIVE DIAGNOSIS: deep vein thrombosis   POST-OPERATIVE DIAGNOSIS: same as above   SURGEON: Brian Chen, MD  ANESTHESIA: conscious sedation  ESTIMATED BLOOD LOSS:  70 cc  CONTRAST: 30 cc  FINDING(S): 1.  Venography demonstrate patent flow channel from venous sheath all the way up to the inferior vena cava: stenosis at common femoral vein visualized, impression of some residual thrombus 2.  Residual thrombus in popliteal and femoral vein: non-occlusive, improved after two passes with Angiojet and venoplasty 3.  Greatly reduced thrombus in common femoral vein: non-occlusive, sub-total resolution after two passes with Angiojet and venoplasty 4.  Patent inferior vena cava and widely patent common iliac vein: prior external compression appears resolved 5.  No further resistance upon aspiration of venous sheath  SPECIMEN(S):  none  INDICATIONS:   Patrick Ingram is a 51 y.o. male who presents with after thrombolysis overnight via left thrombolytic catheter.  The patient presents for: catheter check, possible rheolytic thrombectomy, and possible intervention.  I discussed with the patient the nature of angiographic procedures, especially the limited patencies of any endovascular intervention.  The patient is aware of that the risks of an angiographic procedure include but are not limited to: bleeding, infection, access site complications, renal failure, embolization, rupture of vessel, dissection, possible need for emergent  surgical intervention, possible need for surgical procedures to treat the patient's pathology, and stroke and death.  The patient is aware of the risks and agrees to proceed.  DESCRIPTION: After full informed consent was obtained from the patient, the patient was brought back to the angiography suite.  The patient was placed prone upon the angiography table and connected to cardiopulmonary monitoring equipment.  A circulating radiologic technician maintained continuous monitoring of the patient's cardiopulmonary status.  Additionally, the control room radiologic technician provided backup monitoring throughout the procedure.  The patient was prepped and drape in the standard fashion for an angiographic procedure.  At this point, attention was turned to the right popliteal fossa.  I removed the central cannula of the thrombolytic catheter and exchanged this for a Versacore wire, which I placed in the inferior vena cava.  I did hand injections to image the left leg venous system in stations from the sheath up to the inferior vena cava.  The findings are listed above.    Due to the impression of residual thrombus, I loaded an intravenous ultrasound catheter over the wire.  I interrogated the left leg venous system from the popliteal vein up to the inferior vena cava with the IVUS.  The findings are listed above.  I felt the common femoral vein thrombus load was significantly reduced but there was residual thrombus in the femoropopliteal system.  I elected to used the Zelante rheolytic thrombectomy catheter to evacuate thrombus.  This catheter was connected to the Angiojet system and primed per manufacturer recommended methodology.  I made two passes through the femoropopliteal system from the popliteal vein up to the common femoral vein and back twice, for a total of   143 seconds.  I limited the time to avoid development of acute renal failure.    I reinterrogated the left venous system with the IVUS.  The  thrombus load appeared debulked but there was residual stenosis at the right common femoral vein.  The patient was given 12000 units of Heparin intravenously, which was a therapeutic bolus.   I placed a 10 mm x 80 mm angioplasty balloon and inflated the balloon centered on the stenosis at 8 atm for 3 minutes.   There was no waist upon inflation.  I removed the balloon and reinterrogated the right common femoral vein stenosis with the IVUS again.  I felt there was still residual stenosis, so I placed a 12 mm x 40 mm balloon centered on the common femoral vein.  I inflated this balloon at 10 atm for 2 minutes.  I then placed the 10 mm x 80 mm balloon and reinflated the balloon to profile along the entire right femoropopliteal vein at a range between 4-8 ATM at 30 seconds per inflation.  There were multiple waists noted in the femoral vein and popliteal vein.  The balloon was deflated and removed.  I made a final pass of the IVUS in the femoropopliteal vein.  This demonstrates significant debulking of the thrombus load, with essentially resolution of most pockets of occlusive thrombus.  The stenosis at the common femoral vein appears greatly improved with patent flow channel evident.  I removed the catheter and wire.  The sheath will be reduced on the ACT lowers.  Pressure dressing applied to popliteal fossa.   COMPLICATIONS: none  CONDITION: stable   Adele Barthel, MD, [facility] [place] Office: [phone] Pager: [phone]  11/08/2017, 3:53 PM

## 2017-11-08 NOTE — Progress Notes (Addendum)
   Daily Progress Note   Assessment/Planning:   POD #1 s/p Placement of R thrombolysis catheter   tpA drip stopped overnight around 2 AM due to drop off in fibrinogen.  No clinical evidence of bleeding.  No hematoma in popliteal fossa  Catheter check today with possible angiojet of any residual thrombus  Full anticoagulation afterward   Subjective  - 1 Day Post-Op   No complaints   Objective   Vitals:   11/08/17 0800 11/08/17 0815 11/08/17 0830 11/08/17 0845  BP: 129/77     Pulse: 88 90 82 82  Resp: 18 16 (!) 25 19  Temp:      TempSrc:      SpO2: 98% 97% 96% 94%  Weight:      Height:         Intake/Output Summary (Last 24 hours) at 11/08/2017 0908 Last data filed at 11/08/2017 0900 Gross per 24 hour  Intake 2270.34 ml  Output 1100 ml  Net 1170.34 ml    PULM  CTAB  CV  RRR  GI  soft, NTND  VASC R calf softer than yesterday, no hematoma in R popliteal fossa  NEURO DNVI    Laboratory   CBC CBC Latest Ref Rng & Units 11/08/2017 11/07/2017 11/07/2017  WBC 4.0 - 10.5 K/uL 7.8 9.4 9.6  Hemoglobin 13.0 - 17.0 g/dL 11.7(L) 11.6(L) 12.5(L)  Hematocrit 39.0 - 52.0 % 35.4(L) 35.1(L) 37.6(L)  Platelets 150 - 400 K/uL 172 180 198    BMET    Component Value Date/Time   NA 137 11/08/2017 0345   K 4.0 11/08/2017 0345   CL 107 11/08/2017 0345   CO2 25 11/08/2017 0345   GLUCOSE 101 (H) 11/08/2017 0345   BUN 9 11/08/2017 0345   CREATININE 0.78 11/08/2017 0345   CALCIUM 8.2 (L) 11/08/2017 0345   GFRNONAA >60 11/08/2017 0345   GFRAA >60 11/08/2017 0345     Leonides SakeBrian Chen, MD, FACS [facility] [place] Office: [phone] Pager: [phone]  11/08/2017, 9:08 AM

## 2017-11-08 NOTE — Progress Notes (Signed)
Site area: Right upper leg a 8 french venous sheath was removed  Site Prior to Removal:  Level 0  Pressure Applied For 30 MINUTES    Bedrest Beginning at 1800  Manual:   Yes.    Patient Status During Pull:  ]stable  Post Pull Groin Site:  Level 0  Post Pull Instructions Given:  Yes.    Post Pull Pulses Present:  Yes.    Dressing Applied:  Yes.    Comments:  VS remain stable

## 2017-11-08 NOTE — Progress Notes (Signed)
    Subjective  -   No complaints   Physical Exam:  Leg is maybe less edematous today Doppler signals in each foot       Assessment/Plan:    For f/u lysis today Wants a NOAC for anticoagulation at d/c, likely tomorrow Will have family bring in his compression stockings for post proceedure  Patrick Ingram 11/08/2017 1:42 PM --  Vitals:   11/08/17 1215 11/08/17 1230  BP:    Pulse: 80 82  Resp: 19 18  Temp:    SpO2: 95% 96%    Intake/Output Summary (Last 24 hours) at 11/08/2017 1342 Last data filed at 11/08/2017 1200 Gross per 24 hour  Intake 2573.34 ml  Output 1975 ml  Net 598.34 ml     Laboratory CBC    Component Value Date/Time   WBC 8.7 11/08/2017 0922   HGB 11.9 (L) 11/08/2017 0922   HCT 35.5 (L) 11/08/2017 0922   PLT 168 11/08/2017 0922    BMET    Component Value Date/Time   NA 137 11/08/2017 0345   K 4.0 11/08/2017 0345   CL 107 11/08/2017 0345   CO2 25 11/08/2017 0345   GLUCOSE 101 (H) 11/08/2017 0345   BUN 9 11/08/2017 0345   CREATININE 0.78 11/08/2017 0345   CALCIUM 8.2 (L) 11/08/2017 0345   GFRNONAA >60 11/08/2017 0345   GFRAA >60 11/08/2017 0345    COAG Lab Results  Component Value Date   INR 0.98 11/06/2017   INR 0.98 11/06/2017   No results found for: PTT  Antibiotics Anti-infectives (From admission, onward)   None       V. Patrick CrossWells Ingram IV, M.D. [facility] [place] Office: [phone] Pager:  [phone]

## 2017-11-08 NOTE — Care Management Note (Signed)
Case Management Note  Patient Details  Name: Patrick BeginJorge Ingram MRN: [id number] Date of Birth: [birth year]  Subjective/Objective:  From home with wife, pta indep, presents with femoral dvt,on heparin drip.  He has PCP and medication coverage.  POD 1 placement of R thrombolysis catheter.                   Action/Plan: NCM will follow for dc needs.   Expected Discharge Date:                  Expected Discharge Plan:  Home/Self Care  In-House Referral:     Discharge planning Services  CM Consult  Post Acute Care Choice:    Choice offered to:     DME Arranged:    DME Agency:     HH Arranged:    HH Agency:     Status of Service:  In process, will continue to follow  If discussed at Long Length of Stay Meetings, dates discussed:    Additional Comments:  [name], [name], RN 11/08/2017, 10:47 AM

## 2017-11-08 NOTE — Progress Notes (Signed)
Patient transferred to Cath lab without error

## 2017-11-09 LAB — CBC
HEMATOCRIT: 34.7 % — AB (ref 39.0–52.0)
HEMOGLOBIN: 11.5 g/dL — AB (ref 13.0–17.0)
MCH: 27.6 pg (ref 26.0–34.0)
MCHC: 33.1 g/dL (ref 30.0–36.0)
MCV: 83.2 fL (ref 78.0–100.0)
Platelets: 175 10*3/uL (ref 150–400)
RBC: 4.17 MIL/uL — ABNORMAL LOW (ref 4.22–5.81)
RDW: 15.3 % (ref 11.5–15.5)
WBC: 9.2 10*3/uL (ref 4.0–10.5)

## 2017-11-09 LAB — PROTIME-INR
INR: 1.13
Prothrombin Time: 14.4 seconds (ref 11.4–15.2)

## 2017-11-09 LAB — MAGNESIUM: Magnesium: 1.6 mg/dL — ABNORMAL LOW (ref 1.7–2.4)

## 2017-11-09 MED ORDER — OXYCODONE HCL 5 MG PO TABS: 5.0000 mg | ORAL_TABLET | ORAL | 0 refills | Status: DC | PRN

## 2017-11-09 MED ORDER — WARFARIN SODIUM 5 MG PO TABS: 5.0000 mg | ORAL_TABLET | Freq: Every day | ORAL | 2 refills | Status: DC

## 2017-11-09 MED ORDER — ENOXAPARIN SODIUM 120 MG/0.8ML ~~LOC~~ SOLN: 120.0000 mg | Freq: Two times a day (BID) | SUBCUTANEOUS | 0 refills | Status: DC

## 2017-11-09 NOTE — Discharge Instructions (Signed)

## 2017-11-09 NOTE — Progress Notes (Signed)
Consult placed after office hours Friday. Unable to do benefit check on weekend for Lovenox. MD can call in Rx to pharmacy to fill and once filled, price can be obtained from pharmacy.

## 2017-11-09 NOTE — Progress Notes (Signed)
     Discharge plan Lovenox 120mg  SQ q12h until discharged.  Coumadin 5 mg daily q1800 He is being discharged on a Sat.  He has never been on Coumadin before.  I will ask our office to contact his primary care Physician on Monday and arrange INR follow up visit.  Mosetta PigeonEmma Maureen Collins  PA-C

## 2017-11-09 NOTE — Progress Notes (Signed)
Patient has [employer] with prescription drug coverage  - noted patient was seen by CM this am also  Was asked by bedside nurse to do insurance check for Lovenox, unable to do benefit check do to insurance office is closed on the weekend. Abelino DerrickB Teylor Wolven RN,MHA,BSN

## 2017-11-09 NOTE — Progress Notes (Signed)
ANTICOAGULATION CONSULT NOTE - Follow Up Consult  Pharmacy Consult for Lovenox >> Coumadin Indication: DVT s/p lytic therapy  No Known Allergies  Patient Measurements: Height: 6\' 2"  (188 cm) Weight: 255 lb 11.7 oz (116 kg) IBW/kg (Calculated) : 82.2  Vital Signs: Temp: 98.4 F (36.9 C) (11/17 0806) Temp Source: Oral (11/17 0806) BP: 115/76 (11/17 0806) Pulse Rate: 74 (11/17 0806)  Labs: Recent Labs    11/06/17 1246 11/06/17 1303 11/06/17 1434  11/07/17 0340  11/07/17 2135 11/08/17 0314 11/08/17 0345 11/08/17 0922 11/09/17 0256  HGB 13.1 13.9  --   --  12.4*   < > 11.6* 11.7*  --  11.9* 11.5*  HCT 39.7 41.0  --   --  37.6*   < > 35.1* 35.4*  --  35.5* 34.7*  PLT 208  --   --   --  215   < > 180 172  --  168 175  LABPROT 12.9  --  12.9  --   --   --   --   --   --   --  14.4  INR 0.98  --  0.98  --   --   --   --   --   --   --  1.13  HEPARINUNFRC  --   --   --    < > 1.07*   < > <0.10* <0.10*  --  0.27*  --   CREATININE  --  0.70  --   --  0.72  --   --   --  0.78  --   --    < > = values in this interval not displayed.    Estimated Creatinine Clearance: 144.5 mL/min (by C-G formula based on SCr of 0.78 mg/dL).   Medications:  Scheduled:  . coumadin book   Does not apply Once  . enoxaparin (LOVENOX) injection  120 mg Subcutaneous Q12H  . pantoprazole  40 mg Oral Daily  . phenytoin  400 mg Oral Daily  . potassium chloride  20-40 mEq Oral Once  . sodium chloride flush  3 mL Intravenous Q12H  . sodium chloride flush  3 mL Intravenous Q12H  . Warfarin - Pharmacist Dosing Inpatient   Does not apply q1800   Infusions:  . sodium chloride    . magnesium sulfate 1 - 4 g bolus IVPB      Assessment: [name] s/p lytic therapy (off 11/16 at 0500).  Pt s/p venogram yesterday showed some residual clot but no occlusive disease.  Per orders, started Lovenox/warfarin for treatment of DVT. Went in to counsel patient and son who will be giving lovenox  injections. Patient and son felt a lot more comfortable with the therapy plan and what to monitor while at home. No other questions or concerns were noted.  Of note, patient should not be transitioned to alternative oral anticoagulation (Xarelto or Eliquis) given drug interaction with phenytoin.  Goal of Therapy:  INR 2-3 Anti-Xa level 0.6-1 units/ml 4hrs after LMWH dose given Monitor platelets by anticoagulation protocol: Yes   Plan:  Lovenox 120mg  SQ q12h until discharged. Continue warfarin in outpatient setting, if remains in hospital, schedule dose of warfarin tonight F/u s/sx bleeding, PO intake, DDI  Patrick Ingram Patrick Ingram, PharmD PGY1 Acute Care Pharmacy Resident Pager: [phone] 11/09/2017 8:45 AM

## 2017-11-09 NOTE — Progress Notes (Signed)
   Daily Progress Note   Assessment/Planning:   POD #1 s/p IVUS L leg, rheolytic thrombetomy L leg leg, PTA CFV, FV, and PV; POD #2 L leg lytic catheter placement   Good response to intervention  Unfortunately, NAOC interact with Dilantin so Pharmacy recommend: Lovenox bridge to Coumadin  Will need to set up Coumadin on Monday   Ok to D/C on Lovenox with low dose Coumadin   Subjective  - 1 Day Post-Op   R leg feels better, resolved all pain   Objective   Vitals:   11/09/17 0309 11/09/17 0806 11/09/17 1128 11/09/17 1130  BP: 124/69 115/76 115/71 115/71  Pulse: 78 74 76 86  Resp: 18 15 16 20   Temp: 98.6 F (37 C) 98.4 F (36.9 C) 98.3 F (36.8 C)   TempSrc: Oral Oral Oral   SpO2: 93% 94% 94% 94%  Weight: 255 lb 11.7 oz (116 kg)     Height:         Intake/Output Summary (Last 24 hours) at 11/09/2017 1450 Last data filed at 11/09/2017 1200 Gross per 24 hour  Intake 1010.03 ml  Output 950 ml  Net 60.03 ml    PULM  CTAB  CV  RRR  GI  soft, NTND  VASC No hematoma R knee, R calf markedly softer  NEURO DNVI    Laboratory   CBC CBC Latest Ref Rng & Units 11/09/2017 11/08/2017 11/08/2017  WBC 4.0 - 10.5 K/uL 9.2 8.7 7.8  Hemoglobin 13.0 - 17.0 g/dL 11.5(L) 11.9(L) 11.7(L)  Hematocrit 39.0 - 52.0 % 34.7(L) 35.5(L) 35.4(L)  Platelets 150 - 400 K/uL 175 168 172    BMET    Component Value Date/Time   NA 137 11/08/2017 0345   K 4.0 11/08/2017 0345   CL 107 11/08/2017 0345   CO2 25 11/08/2017 0345   GLUCOSE 101 (H) 11/08/2017 0345   BUN 9 11/08/2017 0345   CREATININE 0.78 11/08/2017 0345   CALCIUM 8.2 (L) 11/08/2017 0345   GFRNONAA >60 11/08/2017 0345   GFRAA >60 11/08/2017 0345     Leonides SakeBrian Chen, MD, FACS [facility] [place] Office: [phone] Pager: [phone]  11/09/2017, 2:50 PM

## 2017-11-11 ENCOUNTER — Telehealth: Payer: Self-pay | Admitting: *Deleted

## 2017-11-11 ENCOUNTER — Telehealth: Payer: Self-pay | Admitting: Vascular Surgery

## 2017-11-11 ENCOUNTER — Encounter (HOSPITAL_COMMUNITY): Payer: Self-pay | Admitting: Vascular Surgery

## 2017-11-11 NOTE — Telephone Encounter (Signed)
-----   Message from Sharee PimpleMarilyn K McChesney, RN sent at 11/11/2017  9:45 AM EST ----- Regarding: 4 weeks    ----- Message ----- From: Fransisco Hertzhen, Brian L, MD Sent: 11/09/2017   2:53 PM To: Vvs Charge Pool  Guy BeginJorge Canevari [id number] 04-Nov-1966  Needs F/U in 4 weeks  Also need to be setup for Coumadin clinic on 11/11/17

## 2017-11-11 NOTE — Telephone Encounter (Signed)
Per Dr. Nicky Pughhen's instruction, [facility] [phone]. Patient will be seen by Dr. Caryn BeeKevin Via tomorrow 11-12-17 at 10:30am for INR / Coumadin Management. [name] was informed of the appt and he will arrive at 10:00am to fill out their paperwork.

## 2017-11-11 NOTE — Discharge Summary (Signed)
[facility] Discharge Summary   Patient ID:  Patrick Ingram MRN: [id number] DOB/AGE: Sep 21, 1966 51 y.o.  Admit date: 11/06/2017 Discharge date:11/09/2017 Date of Surgery: 11/08/2017 Surgeon: Moishe Spice): Fransisco Hertz, MD  Admission Diagnosis: Acute deep vein thrombosis (DVT) of right lower extremity, unspecified vein (HCC) [I82.401]  Discharge Diagnoses:  Acute deep vein thrombosis (DVT) of right lower extremity, unspecified vein (HCC) [I82.401]  Secondary Diagnoses: Past Medical History:  Diagnosis Date  . Ankle ulcer (HCC)    "I've had them on both sides" (11/06/2017)  . DVT (deep venous thrombosis) (HCC) 10/30/2017   RLE  . Grand mal seizure [facility])    LAST SEIZURE 12/25/1991; on daily RX since" (11/06/2017)  . Varicose veins     Procedure(s): LOWER EXTREMITY ANGIOGRAPHY - Lysis Recheck PERIPHERAL VASCULAR BALLOON ANGIOPLASTY  Discharged Condition: good  HPI: Patrick Ingram is a 51 y.o. male, who presented to the office yesterday with complaints of pain and swelling in his right leg which has been present since last Wednesday.  The patient has a history of undergoing bilateral endovenous laser ablation.  He has had trouble with leg swelling and venous ulcers in the past.  He is not on anticoagulation.  His only other medical problem is a seizure disorder for which he is on Dilantin.  His last seizure was in 1993.  Patient did have pins placed in his finger on the left arm Labor Day.  He has no history of intracranial procedures.  He does not have any clotting disorders.  Venous duplex 11/06/2017: shows clot extending up into the distal right external iliac vein, and the common femoral, femoral and popliteal vein.  DX: Acute right lower extremity DVT: The patient will be admitted for IV heparin.  I did discuss with the patient the possibility of thrombolysis and mechanical thrombectomy.      Hospital Course:  Patrick Ingram is a 51 y.o. male is S/P  Right Procedure(s): LOWER EXTREMITY ANGIOGRAPHY - Lysis Recheck PERIPHERAL VASCULAR BALLOON ANGIOPLASTY  Dr. Leonides Sake PROCEDURE: 1.  Right popliteal vein cannulation under ultrasound 2.  Third order venous selection 3.  Placement of catheter in inferior vena cava  4   Inferior vena cavogram 5.  Intravascular ultrasound of right popliteal, femoral, common femoral, external iliac and common iliac vein and inferior vena cava   POD #1 s/p Placement of R thrombolysis catheter   tpA drip stopped overnight around 2 AM due to drop off in fibrinogen.  No clinical evidence of bleeding.  No hematoma in popliteal fossa  Catheter check today with possible angiojet of any residual thrombus  Full anticoagulation afterward   11/08/2017 PROCEDURE: 1.  Left leg venogram 2.  inferior vena cavagram 3.  Intravascular ultrasound right popliteal, femoral, common femoral, external iliac and common iliac vein and inferior vena cava 4.  Rheolytic thrombectomy of right popliteal, femoral, common femoral, and external iliac vein (143 sec) 5.  Venoplasty of right common femoral vein x 2 (10 mm x 80 mm, 12 mm x 40 mm) 6.  Venoplasty of right femoral and popliteal vein (10 mm x 80 mm)  FINDING(S): 1.  Venography demonstrate patent flow channel from venous sheath all the way up to the inferior vena cava: stenosis at common femoral vein visualized, impression of some residual thrombus 2.  Residual thrombus in popliteal and femoral vein: non-occlusive, improved after two passes with Angiojet and venoplasty 3.  Greatly reduced thrombus in common femoral vein: non-occlusive, sub-total resolution after two passes with Angiojet  and venoplasty 4.  Patent inferior vena cava and widely patent common iliac vein: prior external compression appears resolved 5.  No further resistance upon aspiration of venous sheath  POD #1 s/p IVUS L leg, rheolytic thrombetomy L leg leg, PTA CFV, FV, and PV; POD #2 L leg lytic  catheter placement   Good response to intervention  Unfortunately, NAOC interact with Dilantin so Pharmacy recommend: Lovenox bridge to Coumadin  Will need to set up Coumadin on Monday   Ok to D/C on Lovenox with low dose Coumadin     Consults:  Treatment Team:  Nada LibmanBrabham, Vance W, MD  Significant Diagnostic Studies: CBC Lab Results  Component Value Date   WBC 9.2 11/09/2017   HGB 11.5 (L) 11/09/2017   HCT 34.7 (L) 11/09/2017   MCV 83.2 11/09/2017   PLT 175 11/09/2017    BMET    Component Value Date/Time   NA 137 11/08/2017 0345   K 4.0 11/08/2017 0345   CL 107 11/08/2017 0345   CO2 25 11/08/2017 0345   GLUCOSE 101 (H) 11/08/2017 0345   BUN 9 11/08/2017 0345   CREATININE 0.78 11/08/2017 0345   CALCIUM 8.2 (L) 11/08/2017 0345   GFRNONAA >60 11/08/2017 0345   GFRAA >60 11/08/2017 0345   COAG Lab Results  Component Value Date   INR 1.13 11/09/2017   INR 0.98 11/06/2017   INR 0.98 11/06/2017     Disposition:  Discharge to :Home Discharge Instructions    Call MD for:  redness, tenderness, or signs of infection (pain, swelling, bleeding, redness, odor or green/yellow discharge around incision site)   Complete by:  As directed    Call MD for:  severe or increased pain, loss or decreased feeling  in affected limb(s)   Complete by:  As directed    Call MD for:  temperature >100.5   Complete by:  As directed    Discharge instructions   Complete by:  As directed    You may shower in 24 hours as needed   Increase activity slowly   Complete by:  As directed    Walk with assistance use walker or cane as needed   Resume previous diet   Complete by:  As directed      Allergies as of 11/09/2017   No Known Allergies     Medication List    STOP taking these medications   HYDROcodone-acetaminophen 5-325 MG tablet Commonly known as:  NORCO   silver sulfADIAZINE 1 % cream Commonly known as:  SILVADENE     TAKE these medications   enoxaparin 120 MG/0.8ML  injection Commonly known as:  LOVENOX Inject 0.8 mLs (120 mg total) 2 (two) times daily for 12 doses into the skin.   ibuprofen 200 MG tablet Commonly known as:  ADVIL,MOTRIN Take 200 mg every 6 (six) hours as needed by mouth for moderate pain.   oxyCODONE 5 MG immediate release tablet Commonly known as:  Oxy IR/ROXICODONE Take 1-2 tablets (5-10 mg total) every 4 (four) hours as needed by mouth for moderate pain.   phenytoin 100 MG ER capsule Commonly known as:  DILANTIN Take 400 mg daily by mouth.   warfarin 5 MG tablet Commonly known as:  COUMADIN Take 1 tablet (5 mg total) daily at 6 PM by mouth.      Verbal and written Discharge instructions given to the patient. Wound care per Discharge AVS Follow-up Information    Fransisco Hertzhen, Anvith Mauriello L, MD Follow up in 4 week(s).  Specialties:  Vascular Surgery, Cardiology Contact information: [address] [place] [address] [phone]           Signed: Mosetta Pigeonmma Maureen Collins 11/11/2017, 10:43 AM  After discharge care. Per Dr. Nicky Pughhen's instruction, [facility] [phone]. Patient will be seen by Dr. Caryn BeeKevin Via tomorrow 11-12-17 at 10:30am for INR / Coumadin Management. Patrick Ingram was informed of the appt and he will arrive at 10:00am to fill out their paperwork.   Addendum  Patrick Ingram is a 51 y.o. ([birth year]) male who presented with R leg iliofemoral DVT in the setting of prior GSV EVLA.  He was brought to the angio suite for ascending venography and IVUS evaluation.  Based on the findings, I elected to place a thrombolytic catheter overnight.  Due to drop off in fibrinogen, tPA drip was suspended overnight.  On catheter check the neck day, some significant improvement in CFV occlusion was noted.  On IVUS, there was some residual thrombus was present, so I elected to debulk the residual thormbus [facility].  I elected to also balloon angioplasty the CFV down to pop vein.   On completion IVUS, there was a flow channel all the way to the IVC.  The patient was maintained on anticoagulation with Lovenox overnight.  The next day the patient had marked symptomatic improvement in R leg.  The plan was Lovenox bridge to Coumadin due to Dilantin interaction with all NOACs.  The patient will follow up with his PCP for coumadin mgmt.  I'lll see the patient back in the office in 2-4 weeks to follow his progress.  Leonides SakeBrian Aiyana Stegmann, MD, FACS [facility] [place] Office: [phone] Pager: [phone]  11/17/2017, 8:07 PM

## 2017-11-11 NOTE — Telephone Encounter (Signed)
Sched appt 12/20/17 at 8:30. Lm on hm#.

## 2017-11-12 DIAGNOSIS — G40909 Epilepsy, unspecified, not intractable, without status epilepticus: Secondary | ICD-10-CM | POA: Diagnosis not present

## 2017-11-12 DIAGNOSIS — Z7901 Long term (current) use of anticoagulants: Secondary | ICD-10-CM | POA: Diagnosis not present

## 2017-11-12 DIAGNOSIS — I82411 Acute embolism and thrombosis of right femoral vein: Secondary | ICD-10-CM | POA: Diagnosis not present

## 2017-12-12 DIAGNOSIS — S62617D Displaced fracture of proximal phalanx of left little finger, subsequent encounter for fracture with routine healing: Secondary | ICD-10-CM | POA: Diagnosis not present

## 2017-12-12 DIAGNOSIS — M25642 Stiffness of left hand, not elsewhere classified: Secondary | ICD-10-CM | POA: Diagnosis not present

## 2017-12-12 DIAGNOSIS — R29898 Other symptoms and signs involving the musculoskeletal system: Secondary | ICD-10-CM | POA: Diagnosis not present

## 2017-12-12 DIAGNOSIS — M79645 Pain in left finger(s): Secondary | ICD-10-CM | POA: Diagnosis not present

## 2017-12-18 NOTE — Progress Notes (Signed)
Postoperative Visit (Angio)   History of Present Illness   Patrick Ingram is a 51 y.o. male who presents cc:  none.  Prior procedure include: 1.  R leg venogram, IVUS R iliofemoral vein, Angiojet R iliofemoral vein, PTA R CFA, FV, and PV (11/08/17) 2.  R PV cann w/ US, IVUS R iliofemoral vein, placement of lytic catheter (11/07/17)  The prior venous sx from the acute DVT are resolved.  Swelling in R leg remains back at baseline.  The patient is compliant with Coumadin.  The patient is able to complete their activities of daily living.  The patient's current symptoms are: baseline swelling controlled with compression stockings.  Past Medical History, Past Surgical History, Social History, Family History, Medications, Allergies, and Review of Systems are unchanged from previous evaluation on 11/09/17.  Current Outpatient Medications  Medication Sig Dispense Refill  . enoxaparin (LOVENOX) 120 MG/0.8ML injection Inject 0.8 mLs (120 mg total) 2 (two) times daily for 12 doses into the skin. 12 Syringe 0  . ibuprofen (ADVIL,MOTRIN) 200 MG tablet Take 200 mg every 6 (six) hours as needed by mouth for moderate pain.    Marland Kitchen. oxyCODONE (OXY IR/ROXICODONE) 5 MG immediate release tablet Take 1-2 tablets (5-10 mg total) every 4 (four) hours as needed by mouth for moderate pain. 20 tablet 0  . phenytoin (DILANTIN) 100 MG ER capsule Take 400 mg daily by mouth.     . warfarin (COUMADIN) 5 MG tablet Take 1 tablet (5 mg total) daily at 6 PM by mouth. 30 tablet 2   No current facility-administered medications for this visit.     ROS: no venous ulcers, no bursting sensation in both legs   For VQI Use Only   PRE-ADM LIVING: Home  AMB STATUS: Ambulatory   Physical Examination   Vitals:   12/20/17 0824  BP: 120/81  Pulse: 86  Resp: 18  Temp: 98.3 F (36.8 C)  TempSrc: Oral  SpO2: 96%   There is no height or weight on file to calculate BMI.  General Alert, O x 3, WD, NAD  Pulmonary Sym  exp, good B air movt, CTA B  Cardiac RRR, Nl S1, S2, no Murmurs, No rubs, No S3,S4  Vascular Vessel Right Left  Radial Palpable Palpable  Brachial Palpable Palpable  Carotid Palpable, No Bruit Palpable, No Bruit  Aorta Not palpable N/A  Femoral Palpable Palpable  Popliteal Not palpable Not palpable  PT Not palpable Not palpable  DP Not palpable Not palpable    Gastrointestinal soft, non-distended, non-tender to palpation, No guarding or rebound, no HSM, no masses, no CVAT B, No palpable prominent aortic pulse,    Musculoskeletal M/S 5/5 throughout  , Extremities without ischemic changes  , Edema present: B 1-2+, varicosities present in both legs, no LDS, prior healed VSU R>L  Neurologic  Pain and light touch intact in extremities , Motor exam as listed above    Medical Decision Making   Patrick BeginJorge Ingram is a 51 y.o. male who presents s/p lytic catheter placement R venous system, Angiojet R iliofemoral vein, PTA R CFV, FV and PV.  Based on his angiographic findings, this patient needs: anticoagulation for 3 months and re-evaluation in 3 months. Pt is currently on Coumadin. After  re-evaluation in 3 months, will refer to Hematology for thrombophilia work-up at that time.  Thank you for allowing us to participate in this patient's care.   Leonides SakeBrian Chen, MD, FACS [facility] [place] Office: [phone] Pager:  [phone]  

## 2017-12-19 DIAGNOSIS — R29898 Other symptoms and signs involving the musculoskeletal system: Secondary | ICD-10-CM | POA: Diagnosis not present

## 2017-12-19 DIAGNOSIS — M25642 Stiffness of left hand, not elsewhere classified: Secondary | ICD-10-CM | POA: Diagnosis not present

## 2017-12-20 ENCOUNTER — Ambulatory Visit (INDEPENDENT_AMBULATORY_CARE_PROVIDER_SITE_OTHER): Payer: BLUE CROSS/BLUE SHIELD | Admitting: Vascular Surgery

## 2017-12-20 ENCOUNTER — Encounter: Payer: Self-pay | Admitting: Vascular Surgery

## 2017-12-20 ENCOUNTER — Other Ambulatory Visit: Payer: Self-pay

## 2017-12-20 VITALS — BP 120/81 | HR 86 | Temp 98.3°F | Resp 18

## 2017-12-20 DIAGNOSIS — I824Y1 Acute embolism and thrombosis of unspecified deep veins of right proximal lower extremity: Secondary | ICD-10-CM | POA: Diagnosis not present

## 2017-12-20 DIAGNOSIS — I83012 Varicose veins of right lower extremity with ulcer of calf: Secondary | ICD-10-CM

## 2017-12-20 DIAGNOSIS — L97211 Non-pressure chronic ulcer of right calf limited to breakdown of skin: Secondary | ICD-10-CM | POA: Diagnosis not present

## 2017-12-20 DIAGNOSIS — I868 Varicose veins of other specified sites: Secondary | ICD-10-CM

## 2017-12-26 DIAGNOSIS — S62617D Displaced fracture of proximal phalanx of left little finger, subsequent encounter for fracture with routine healing: Secondary | ICD-10-CM | POA: Diagnosis not present

## 2017-12-26 DIAGNOSIS — M25642 Stiffness of left hand, not elsewhere classified: Secondary | ICD-10-CM | POA: Diagnosis not present

## 2017-12-26 DIAGNOSIS — R29898 Other symptoms and signs involving the musculoskeletal system: Secondary | ICD-10-CM | POA: Diagnosis not present

## 2018-01-02 DIAGNOSIS — R29898 Other symptoms and signs involving the musculoskeletal system: Secondary | ICD-10-CM | POA: Diagnosis not present

## 2018-01-02 DIAGNOSIS — M79645 Pain in left finger(s): Secondary | ICD-10-CM | POA: Diagnosis not present

## 2018-01-02 DIAGNOSIS — M25642 Stiffness of left hand, not elsewhere classified: Secondary | ICD-10-CM | POA: Diagnosis not present

## 2018-01-08 DIAGNOSIS — M25642 Stiffness of left hand, not elsewhere classified: Secondary | ICD-10-CM | POA: Diagnosis not present

## 2018-01-08 DIAGNOSIS — S62617D Displaced fracture of proximal phalanx of left little finger, subsequent encounter for fracture with routine healing: Secondary | ICD-10-CM | POA: Diagnosis not present

## 2018-01-22 DIAGNOSIS — R29898 Other symptoms and signs involving the musculoskeletal system: Secondary | ICD-10-CM | POA: Diagnosis not present

## 2018-01-22 DIAGNOSIS — M25642 Stiffness of left hand, not elsewhere classified: Secondary | ICD-10-CM | POA: Diagnosis not present

## 2018-02-13 DIAGNOSIS — G40909 Epilepsy, unspecified, not intractable, without status epilepticus: Secondary | ICD-10-CM | POA: Diagnosis not present

## 2018-02-13 DIAGNOSIS — Z0001 Encounter for general adult medical examination with abnormal findings: Secondary | ICD-10-CM | POA: Diagnosis not present

## 2018-02-13 DIAGNOSIS — I82411 Acute embolism and thrombosis of right femoral vein: Secondary | ICD-10-CM | POA: Diagnosis not present

## 2018-02-13 DIAGNOSIS — Z7901 Long term (current) use of anticoagulants: Secondary | ICD-10-CM | POA: Diagnosis not present

## 2018-02-26 DIAGNOSIS — Z7901 Long term (current) use of anticoagulants: Secondary | ICD-10-CM | POA: Diagnosis not present

## 2018-02-26 LAB — PROTIME-INR

## 2018-03-12 DIAGNOSIS — Z7901 Long term (current) use of anticoagulants: Secondary | ICD-10-CM | POA: Diagnosis not present

## 2018-03-24 DIAGNOSIS — Z01818 Encounter for other preprocedural examination: Secondary | ICD-10-CM | POA: Diagnosis not present

## 2018-03-24 DIAGNOSIS — Z1211 Encounter for screening for malignant neoplasm of colon: Secondary | ICD-10-CM | POA: Diagnosis not present

## 2018-03-24 DIAGNOSIS — Z7901 Long term (current) use of anticoagulants: Secondary | ICD-10-CM | POA: Diagnosis not present

## 2018-03-31 NOTE — Progress Notes (Signed)
    Postoperative Visit (Angio)   History of Present Illness   Patrick Ingram is a 52 y.o. male who presents cc:  "Swelling much better".  Prior procedure include: 1. R leg venogram, IVUS R iliofemoral vein, Angiojet R iliofemoral vein, PTA R CFA, FV and PV (11/08/17) 2. R PV cann w/ US, IVUS R iliofemoral vein, placement of lytic catheter (11/07/17)  The patient's current symptoms are: none.  Right leg swelling essentially back to baseline.  Patient is compliant with compression stockings and Warfarin.  The patient is able to complete their activities of daily living.    Past Medical History, Past Surgical History, Social History, Family History, Medications, Allergies, and Review of Systems were reviewed on 04/02/18 are unchanged from previous evaluation on 12/20/17.  Current Outpatient Medications  Medication Sig Dispense Refill  . enoxaparin (LOVENOX) 120 MG/0.8ML injection Inject 0.8 mLs (120 mg total) 2 (two) times daily for 12 doses into the skin. 12 Syringe 0  . ibuprofen (ADVIL,MOTRIN) 200 MG tablet Take 200 mg every 6 (six) hours as needed by mouth for moderate pain.    Marland Kitchen. oxyCODONE (OXY IR/ROXICODONE) 5 MG immediate release tablet Take 1-2 tablets (5-10 mg total) every 4 (four) hours as needed by mouth for moderate pain. (Patient not taking: Reported on 12/20/2017) 20 tablet 0  . phenytoin (DILANTIN) 100 MG ER capsule Take 400 mg daily by mouth.     . warfarin (COUMADIN) 5 MG tablet Take 1 tablet (5 mg total) daily at 6 PM by mouth. 30 tablet 2   No current facility-administered medications for this visit.     ROS: no bleeding complications, LFT bump   For VQI Use Only   PRE-ADM LIVING: Home  AMB STATUS: Ambulatory   Physical Examination   Vitals:   04/02/18 0901  BP: 101/74  Pulse: 92  Resp: 16  Temp: 98.6 F (37 C)  TempSrc: Oral  SpO2: 96%  Weight: 256 lb (116.1 kg)  Height: 6\' 2"  (1.88 m)   Body mass index is 32.87 kg/m.  General Alert, O x 3, WD,  NAD  Vascular Vessel Right Left  Femoral Palpable Palpable  Popliteal Not palpable Not palpable  PT Palpable Palpable  DP Palpable Palpable    Musculoskeletal M/S 5/5 throughout, Extremities without ischemic changes, RLE minimal edema, LLE compresion stocking on, R leg: extensive varicosities, stasis dermatitis changes at medial ankle,  Neurologic  Pain and light touch intact in extremities, Motor exam as listed above    Medical Decision Making   Patrick Ingram is a 52 y.o. male who presents s/p lytic catheter placement R venous system, Angiojet R iliofemoral vein, PTA R CFV, FV and PV for sx R iliofemoral DVT.  This patient has been compliant with both compression and anticoagulation. Given patient's continued use of Dilantin and interaction with any anticoagulant, would refer pt to Hematology for thrombophilia work-up to determine whether long-term anticoagulation is necessary. Would repeat DVT duplex in 3 months to reinterrogate the R venous system.  Unclear to me if pt really has a CFV stenosis, as the intra-procedural findings during acute DVT are sometime misleading due to vessel underfilling.  Thank you for allowing us to participate in this patient's care.   Leonides SakeBrian Kenyana Husak, MD, FACS [facility] [place] Office: [phone] Pager: [phone]

## 2018-04-02 ENCOUNTER — Encounter: Payer: Self-pay | Admitting: Vascular Surgery

## 2018-04-02 ENCOUNTER — Ambulatory Visit: Payer: BLUE CROSS/BLUE SHIELD | Admitting: Vascular Surgery

## 2018-04-02 ENCOUNTER — Other Ambulatory Visit: Payer: Self-pay

## 2018-04-02 VITALS — BP 101/74 | HR 92 | Temp 98.6°F | Resp 16 | Ht 74.0 in | Wt 256.0 lb

## 2018-04-02 DIAGNOSIS — I82411 Acute embolism and thrombosis of right femoral vein: Secondary | ICD-10-CM

## 2018-04-02 DIAGNOSIS — Z7901 Long term (current) use of anticoagulants: Secondary | ICD-10-CM

## 2018-04-02 DIAGNOSIS — I868 Varicose veins of other specified sites: Secondary | ICD-10-CM

## 2018-04-02 DIAGNOSIS — I824Y1 Acute embolism and thrombosis of unspecified deep veins of right proximal lower extremity: Secondary | ICD-10-CM

## 2018-04-07 ENCOUNTER — Other Ambulatory Visit: Payer: Self-pay

## 2018-04-07 DIAGNOSIS — I82411 Acute embolism and thrombosis of right femoral vein: Secondary | ICD-10-CM

## 2018-04-07 DIAGNOSIS — I824Y1 Acute embolism and thrombosis of unspecified deep veins of right proximal lower extremity: Secondary | ICD-10-CM

## 2018-04-14 ENCOUNTER — Other Ambulatory Visit: Payer: BLUE CROSS/BLUE SHIELD

## 2018-04-14 ENCOUNTER — Ambulatory Visit: Payer: BLUE CROSS/BLUE SHIELD | Admitting: Hematology & Oncology

## 2018-05-01 ENCOUNTER — Inpatient Hospital Stay: Payer: BLUE CROSS/BLUE SHIELD | Admitting: Hematology & Oncology

## 2018-05-01 ENCOUNTER — Inpatient Hospital Stay: Payer: BLUE CROSS/BLUE SHIELD | Attending: Hematology & Oncology

## 2018-05-08 ENCOUNTER — Other Ambulatory Visit: Payer: Self-pay | Admitting: *Deleted

## 2018-05-08 ENCOUNTER — Telehealth: Payer: Self-pay | Admitting: *Deleted

## 2018-05-08 DIAGNOSIS — I83009 Varicose veins of unspecified lower extremity with ulcer of unspecified site: Secondary | ICD-10-CM

## 2018-05-08 DIAGNOSIS — L97909 Non-pressure chronic ulcer of unspecified part of unspecified lower leg with unspecified severity: Principal | ICD-10-CM

## 2018-05-08 MED ORDER — SILVER SULFADIAZINE 1 % EX CREA: TOPICAL_CREAM | CUTANEOUS | 1 refills | Status: AC

## 2018-05-08 NOTE — Telephone Encounter (Signed)
Patient called in for a refill of his silvadene to treat a small ulcer that has developed. Perscription sent via Epic. Follow rpn.

## 2018-05-20 DIAGNOSIS — D126 Benign neoplasm of colon, unspecified: Secondary | ICD-10-CM | POA: Diagnosis not present

## 2018-05-20 DIAGNOSIS — K648 Other hemorrhoids: Secondary | ICD-10-CM | POA: Diagnosis not present

## 2018-05-20 DIAGNOSIS — Z1211 Encounter for screening for malignant neoplasm of colon: Secondary | ICD-10-CM | POA: Diagnosis not present

## 2018-05-20 DIAGNOSIS — K644 Residual hemorrhoidal skin tags: Secondary | ICD-10-CM | POA: Diagnosis not present

## 2018-05-23 DIAGNOSIS — Z1211 Encounter for screening for malignant neoplasm of colon: Secondary | ICD-10-CM | POA: Diagnosis not present

## 2018-05-23 DIAGNOSIS — D126 Benign neoplasm of colon, unspecified: Secondary | ICD-10-CM | POA: Diagnosis not present

## 2018-06-23 DIAGNOSIS — Z79899 Other long term (current) drug therapy: Secondary | ICD-10-CM | POA: Diagnosis not present

## 2018-06-23 DIAGNOSIS — G40309 Generalized idiopathic epilepsy and epileptic syndromes, not intractable, without status epilepticus: Secondary | ICD-10-CM | POA: Diagnosis not present

## 2018-07-02 ENCOUNTER — Encounter (HOSPITAL_COMMUNITY): Payer: BLUE CROSS/BLUE SHIELD

## 2018-07-02 ENCOUNTER — Ambulatory Visit: Payer: BLUE CROSS/BLUE SHIELD | Admitting: Vascular Surgery

## 2018-07-08 NOTE — Progress Notes (Signed)
Established Venous Insufficiency   History of Present Illness   Guy BeginJorge Bogle is a 52 y.o. (November 15, 1966) male who presents with chief complaint: continued B leg swelling.    Prior procedures include: 1. R leg venogram, IVUS R iliofem vein, Angiojet R iliofem vein, PTA R CFV, FV and PV (11/08/17) 2. R PV cann w/ US IVUS R iliofemoral vein, placement of lytic catheter (11/07/17)  The patient's symptoms have not progressed.  The patient's symptoms are: B leg swelling.  The patient is compliant with compression stockings.  Pt is compliant with Warfarin  The patient's PMH, PSH, SH, and FamHx were reviewed on 07/09/18 are unchanged from 04/02/18.  Current Outpatient Medications  Medication Sig Dispense Refill  . enoxaparin (LOVENOX) 120 MG/0.8ML injection Inject 0.8 mLs (120 mg total) 2 (two) times daily for 12 doses into the skin. 12 Syringe 0  . ibuprofen (ADVIL,MOTRIN) 200 MG tablet Take 200 mg every 6 (six) hours as needed by mouth for moderate pain.    Marland Kitchen. oxyCODONE (OXY IR/ROXICODONE) 5 MG immediate release tablet Take 1-2 tablets (5-10 mg total) every 4 (four) hours as needed by mouth for moderate pain. (Patient not taking: Reported on 12/20/2017) 20 tablet 0  . phenytoin (DILANTIN) 100 MG ER capsule Take 400 mg daily by mouth.     . silver sulfADIAZINE (SILVADENE) 1 % cream Apply to affected area daily 50 g 1  . warfarin (COUMADIN) 5 MG tablet Take 1 tablet (5 mg total) daily at 6 PM by mouth. 30 tablet 2   No current facility-administered medications for this visit.     No Known Allergies  On ROS today: cont leg swelling, L shin injury   Physical Examination   Vitals:   07/09/18 1537  BP: 120/79  Pulse: 85  Resp: 20  Temp: 98.3 F (36.8 C)  TempSrc: Oral  SpO2: 94%  Weight: 258 lb (117 kg)  Height: 6\' 2"  (1.88 m)   Body mass index is 33.13 kg/m.  General Alert, O x 3, WD, NAD  Pulmonary Sym exp, good B air movt, CTA B  Cardiac RRR, Nl S1, S2, no Murmurs, No  rubs, No S3,S4  Vascular Vessel Right Left  Radial Palpable Palpable  Brachial Palpable Palpable  Carotid Palpable, No Bruit Palpable, No Bruit  Aorta Not palpable N/A  Femoral Palpable Palpable  Popliteal Not palpable Not palpable  PT Palpable Palpable  DP Palpable Palpable    Gastro- intestinal soft, non-distended, non-tender to palpation, No guarding or rebound, no HSM, no masses, no CVAT B, No palpable prominent aortic pulse,    Musculo- skeletal M/S 5/5 throughout  , Extremities without ischemic changes  , Non-pitting edema present: B 2+, Varicosities present: B large varicosities evident, No Lipodermatosclerosis present  Neurologic Pain and light touch intact in extremities , Motor exam as listed above     Non-Invasive Vascular Imaging   BLE Venous Duplex (07/09/2018):   RLE:   + DVT: chronic pop DVT  + SVT:: GSV partial thrombosis, calf SVT  Peroneal not visualized  LLE:  No DVT and SVT,   GSV thickened and partially compressible   Medical Decision Making   Guy BeginJorge Garton is a 52 y.o. male who presents with: s/p Lytic catheter placement R venous system, Angiojet R iliofemoral vein, PTA R CFV, FV and PV for sx R iliofem DVT   Proximal treated segment of R leg appears patent.  There appears to be no obvious need for femoral vein stenting at this  point.  Unclear if patient ever went to see Hematology.  Will try to get him back in to get his thrombophilia evaluated to see his can come off his anticoagulation, given the seizure medication interactions with anticoagulation choices.  The patient can follow up with [place] as needed.  Thank you for allowing [place] to participate in this patient's care.   Leonides Sake, MD, FACS [facility] [place] Office: [phone] Pager: [phone]

## 2018-07-09 ENCOUNTER — Other Ambulatory Visit: Payer: Self-pay

## 2018-07-09 ENCOUNTER — Ambulatory Visit (HOSPITAL_COMMUNITY)
Admission: RE | Admit: 2018-07-09 | Discharge: 2018-07-09 | Disposition: A | Discharge status: Home / Self Care | Payer: BLUE CROSS/BLUE SHIELD | Source: Ambulatory Visit | Attending: Vascular Surgery | Admitting: Vascular Surgery

## 2018-07-09 ENCOUNTER — Encounter: Payer: Self-pay | Admitting: Vascular Surgery

## 2018-07-09 ENCOUNTER — Ambulatory Visit (INDEPENDENT_AMBULATORY_CARE_PROVIDER_SITE_OTHER): Payer: BLUE CROSS/BLUE SHIELD | Admitting: Vascular Surgery

## 2018-07-09 VITALS — BP 120/79 | HR 85 | Temp 98.3°F | Resp 20 | Ht 74.0 in | Wt 258.0 lb

## 2018-07-09 DIAGNOSIS — I824Y1 Acute embolism and thrombosis of unspecified deep veins of right proximal lower extremity: Secondary | ICD-10-CM | POA: Diagnosis not present

## 2018-07-09 DIAGNOSIS — I82411 Acute embolism and thrombosis of right femoral vein: Secondary | ICD-10-CM | POA: Diagnosis not present

## 2018-12-22 ENCOUNTER — Encounter: Payer: BLUE CROSS/BLUE SHIELD | Admitting: Vascular Surgery
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
## 2019-03-17 ENCOUNTER — Telehealth: Payer: Self-pay | Admitting: Internal Medicine

## 2019-03-17 NOTE — Telephone Encounter (Signed)
A new hem appt has been scheduled for the pt to see Dr. Melton Alar on 3/30 at 2pm. Pt aware to arrive 15 minutes early.

## 2019-03-23 ENCOUNTER — Telehealth: Payer: Self-pay | Admitting: Internal Medicine

## 2019-03-23 ENCOUNTER — Other Ambulatory Visit: Payer: Self-pay

## 2019-03-23 ENCOUNTER — Inpatient Hospital Stay: Payer: BLUE CROSS/BLUE SHIELD | Attending: Internal Medicine | Admitting: Internal Medicine

## 2019-03-23 ENCOUNTER — Inpatient Hospital Stay: Payer: BLUE CROSS/BLUE SHIELD

## 2019-03-23 ENCOUNTER — Encounter: Payer: Self-pay | Admitting: Internal Medicine

## 2019-03-23 VITALS — BP 119/81 | HR 88 | Temp 98.8°F | Resp 18 | Ht 74.0 in | Wt 267.6 lb

## 2019-03-23 DIAGNOSIS — Z86718 Personal history of other venous thrombosis and embolism: Secondary | ICD-10-CM | POA: Diagnosis present

## 2019-03-23 DIAGNOSIS — I82411 Acute embolism and thrombosis of right femoral vein: Secondary | ICD-10-CM

## 2019-03-23 DIAGNOSIS — R0602 Shortness of breath: Secondary | ICD-10-CM

## 2019-03-23 DIAGNOSIS — D6859 Other primary thrombophilia: Secondary | ICD-10-CM | POA: Diagnosis not present

## 2019-03-23 DIAGNOSIS — M25551 Pain in right hip: Secondary | ICD-10-CM | POA: Diagnosis not present

## 2019-03-23 DIAGNOSIS — Z79899 Other long term (current) drug therapy: Secondary | ICD-10-CM | POA: Diagnosis not present

## 2019-03-23 DIAGNOSIS — R634 Abnormal weight loss: Secondary | ICD-10-CM

## 2019-03-23 DIAGNOSIS — I82511 Chronic embolism and thrombosis of right femoral vein: Secondary | ICD-10-CM

## 2019-03-23 DIAGNOSIS — R569 Unspecified convulsions: Secondary | ICD-10-CM | POA: Insufficient documentation

## 2019-03-23 DIAGNOSIS — I872 Venous insufficiency (chronic) (peripheral): Secondary | ICD-10-CM

## 2019-03-23 DIAGNOSIS — Z8669 Personal history of other diseases of the nervous system and sense organs: Secondary | ICD-10-CM

## 2019-03-23 DIAGNOSIS — Z8249 Family history of ischemic heart disease and other diseases of the circulatory system: Secondary | ICD-10-CM

## 2019-03-23 DIAGNOSIS — Z7901 Long term (current) use of anticoagulants: Secondary | ICD-10-CM | POA: Diagnosis not present

## 2019-03-23 LAB — CMP (CANCER CENTER ONLY)
ALT: 23 U/L (ref 0–44)
AST: 22 U/L (ref 15–41)
Albumin: 3.5 g/dL (ref 3.5–5.0)
Alkaline Phosphatase: 131 U/L — ABNORMAL HIGH (ref 38–126)
Anion gap: 8 (ref 5–15)
BILIRUBIN TOTAL: 0.3 mg/dL (ref 0.3–1.2)
BUN: 19 mg/dL (ref 6–20)
CO2: 24 mmol/L (ref 22–32)
Calcium: 8.8 mg/dL — ABNORMAL LOW (ref 8.9–10.3)
Chloride: 109 mmol/L (ref 98–111)
Creatinine: 0.87 mg/dL (ref 0.61–1.24)
GFR, Est AFR Am: >60 mL/min (ref >60)
GFR, Estimated: >60 mL/min (ref >60)
Glucose, Bld: 101 mg/dL — ABNORMAL HIGH (ref 70–99)
POTASSIUM: 4.4 mmol/L (ref 3.5–5.1)
SODIUM: 141 mmol/L (ref 135–145)
TOTAL PROTEIN: 8.4 g/dL — AB (ref 6.5–8.1)

## 2019-03-23 LAB — CBC WITH DIFFERENTIAL (CANCER CENTER ONLY)
Abs Immature Granulocytes: 0.03 10*3/uL (ref 0.00–0.07)
BASOS ABS: 0.1 10*3/uL (ref 0.0–0.1)
Basophils Relative: 1 %
EOS ABS: 0.3 10*3/uL (ref 0.0–0.5)
Eosinophils Relative: 5 %
HEMATOCRIT: 43.9 % (ref 39.0–52.0)
HEMOGLOBIN: 14 g/dL (ref 13.0–17.0)
Immature Granulocytes: 0 %
LYMPHS ABS: 2.1 10*3/uL (ref 0.7–4.0)
LYMPHS PCT: 29 %
MCH: 26.8 pg (ref 26.0–34.0)
MCHC: 31.9 g/dL (ref 30.0–36.0)
MCV: 84.1 fL (ref 80.0–100.0)
Monocytes Absolute: 0.5 10*3/uL (ref 0.1–1.0)
Monocytes Relative: 7 %
NEUTROS PCT: 58 %
NRBC: 0 % (ref 0.0–0.2)
Neutro Abs: 4.2 10*3/uL (ref 1.7–7.7)
Platelet Count: 269 10*3/uL (ref 150–400)
RBC: 5.22 MIL/uL (ref 4.22–5.81)
RDW: 15.6 % — AB (ref 11.5–15.5)
WBC Count: 7.2 10*3/uL (ref 4.0–10.5)

## 2019-03-23 LAB — IRON AND TIBC
IRON: 55 ug/dL (ref 42–163)
SATURATION RATIOS: 19 % — AB (ref 20–55)
TIBC: 290 ug/dL (ref 202–409)
UIBC: 235 ug/dL (ref 117–376)

## 2019-03-23 LAB — LACTATE DEHYDROGENASE: LDH: 167 U/L (ref 98–192)

## 2019-03-23 LAB — FERRITIN: Ferritin: 86 ng/mL (ref 24–336)

## 2019-03-23 NOTE — Telephone Encounter (Signed)
Not able to schedule a vas of lower extremity, has to be a STAT.  Due to the covid-19 that is going on, all locations are only scheduling STAT appts. [phone]) and [phone]

## 2019-03-23 NOTE — Progress Notes (Signed)
Referring Physician:  Meridee Score, FNP of [facility].    Diagnosis Chronic deep vein thrombosis (DVT) of femoral vein of right lower extremity (HCC) - Plan: CBC with Differential (Cancer Center Only), CMP (Cancer Center only), Lactate dehydrogenase (LDH), Ferritin, Iron and TIBC, SPEP with reflex to IFE, ANA, IFA (with reflex), Rheumatoid factor, Prothrombin gene mutation*, Lupus anticoagulant panel*, Factor 5 Leiden*, Beta-2-glycoprotein i abs, IgG/M/A, VAS [place] LOWER EXTREMITY VENOUS (DVT)  Shortness of breath - Plan: CBC with Differential (Cancer Center Only), CMP (Cancer Center only), Lactate dehydrogenase (LDH), Ferritin, Iron and TIBC, SPEP with reflex to IFE, ANA, IFA (with reflex), Rheumatoid factor, Prothrombin gene mutation*, Lupus anticoagulant panel*, Factor 5 Leiden*, Beta-2-glycoprotein i abs, IgG/M/A, CT CHEST W CONTRAST, VAS [place] LOWER EXTREMITY VENOUS (DVT)  Abnormal weight loss - Plan: CBC with Differential (Cancer Center Only), CMP (Cancer Center only), Lactate dehydrogenase (LDH), Ferritin, Iron and TIBC, SPEP with reflex to IFE, ANA, IFA (with reflex), Rheumatoid factor, Prothrombin gene mutation*, Lupus anticoagulant panel*, Factor 5 Leiden*, Beta-2-glycoprotein i abs, IgG/M/A, CT ABDOMEN PELVIS W CONTRAST, VAS [place] LOWER EXTREMITY VENOUS (DVT)  Acute deep vein thrombosis (DVT) of femoral vein of right lower extremity (HCC) - Plan: CBC with Differential (Cancer Center Only), CMP (Cancer Center only), Lactate dehydrogenase (LDH), Ferritin, Iron and TIBC, SPEP with reflex to IFE, ANA, IFA (with reflex), Rheumatoid factor, Prothrombin gene mutation*, Lupus anticoagulant panel*, Factor 5 Leiden*, Beta-2-glycoprotein i abs, IgG/M/A, VAS [place] LOWER EXTREMITY VENOUS (DVT)  Pain in joint involving right pelvic region and thigh - Plan: CBC with Differential (Cancer Center Only), CMP (Cancer Center only), Lactate dehydrogenase (LDH), Ferritin, Iron and TIBC, SPEP with reflex to  IFE, ANA, IFA (with reflex), Rheumatoid factor, Prothrombin gene mutation*, Lupus anticoagulant panel*, Factor 5 Leiden*, Beta-2-glycoprotein i abs, IgG/M/A, VAS [place] LOWER EXTREMITY VENOUS (DVT)  Staging Cancer Staging No matching staging information was found for the patient.  Assessment and Plan:  1.  Right LE DVT.  53 year old male referred for evaluation due to DVT.  Pt reportedly diagnosed with DVT in 2019.  He has been on coumadin since that time.  He reports he had vein stripping done 10 years ago.  He had a Lytic catheter placement R venous system, Angiojet R iliofemoral vein, PTA R CFV, FV and PV for sx R iliofem DVT.  He was last seen by Dr. Imogene Burn in 06/2018 who recommended:   Proximal treated segment of R leg appears patent.  There appears to be no obvious need for femoral vein stenting at this point.  Unclear if patient ever went to see Hematology.  Will try to get him back in to get his thrombophilia evaluated to see his can come off his anticoagulation, given the seizure medication interactions with anticoagulation choice.    Pt reports father had heart attack but he lives in [place] and treated himself with foxglove.  He denies any family history of DVT.    Pt denies recent travel other than every 1 to 2 years.  He denies smoking or ETOH use.    He is currently on coumadin 3 mg daily.  He is followed by his PCP for monitoring.  He reports seizure history diagnosed in 1993 with no clear etiology.  He is on Dilantin.    Pt is seen today for consultation due to DVT.    Labs done today 03/23/2019 reviewed and showed WBC 7.2 HB 14 plts 269,000.  Chemistries WNL with K+ 4.4 Cr 0.87 normal  LFTs.  LDH 167.  Ferritin 86.  Awaiting results of hypercoagulable evaluation.    Long talk held with pt today.  Last doppler evaluation was done7/17/2019 that showed:  Final Interpretation: Right: No evidence of acute right lower extremity deep vein thrombosis. Contracted right popliteal vein  appearing chronic. The peroneal vein not visualized. Non compressible varicose veins in the distal calf that appear to be near but not in the deep calf vein age undetermined. The greater saphenous vein in the calf appears partially incompressible. No thrombus visualized in the distal external iliac vein. Left: There is no evidence of acute deep vein thrombosis in the lower extremity. Thickened greater saphenous vein is partially compressible.  Pt will be set up for CT CAP for evaluation for occult malignancy.  He will be set up for bilateral LE doppler for interval evaluation.  He is currently on coumadin and should continue coumadin monitoring through PCP.  Pt reports history of vein stripping in the past.  He will RTC in 2 weeks to go over labs and imaging results. Further recommendations to follow once imaging reviewed.   He should notify the office if any problems prior to his next visit.    2.  Family history of CAD.  Pt reports father had heart attack but he lives in [place] and treated himself with foxglove.  Foxglove contains chemicals from which the prescription medication digoxin (Lanoxin) is made. He denies any family history of DVT.   3.  Chronic venous insufficiency.  Pt reports he has vein stripping done in the past.  He wears compression hose.    4  Seizure.  Pt reports last seizure was in 1993.  He is on Dilantin.  Follow-up with PCP or neurology for monitoring.    5.  Health maintenance.  GI follow-up as recommended.    40 minutes spent with more than 50% spent in review of records, counseling and coordination of care.    HPI:  53 year old male referred for evaluation due to DVT.  Pt reportedly diagnosed with DVT in 2019.  He has been on coumadin since that time.  He reports he had vein stripping done 10 years ago.  He had a Lytic catheter placement R venous system, Angiojet R iliofemoral vein, PTA R CFV, FV and PV for sx R iliofem DVT.  He was last seen by Dr. Imogene Burn in 06/2018  who recommended:   Proximal treated segment of R leg appears patent.  There appears to be no obvious need for femoral vein stenting at this point.  Unclear if patient ever went to see Hematology.  Will try to get him back in to get his thrombophilia evaluated to see his can come off his anticoagulation, given the seizure medication interactions with anticoagulation choice.    Pt reports father had heart attack but he lives in [place] and treated himself with foxglove.  He denies any family history of DVT.    Pt denies recent travel other than every 1 to 2 years.  He denies smoking or ETOH use.    He is currently on coumadin 3 mg daily.  He is followed by his PCP for monitoring.  He reports seizure history diagnosed in 1993 with no clear etiology.  He is on Dilantin.    Pt is seen today for consultation due to DVT.    Problem List Patient Active Problem List   Diagnosis Date Noted  . DVT of proximal leg (deep vein thrombosis) (HCC) [I82.4Y9]  11/07/2017  . Dvt femoral (deep venous thrombosis) (HCC) [I82.419] 11/06/2017  . Acute deep vein thrombosis (DVT) of femoral vein of right lower extremity (HCC) [I82.411] 11/05/2017  . Varicose veins of lower extremities with ulcer (HCC) [I83.009, L97.909] 06/25/2013  . Venous stasis ulcer (HCC) [I83.009, L97.909] 06/09/2013  . Tenderness-left lateral ankle [R52] 06/09/2013  . Chronic venous insufficiency [I87.2] 06/09/2013  . Varicose veins of lower extremities with other complications [I83.893] 03/18/2012    Past Medical History Past Medical History:  Diagnosis Date  . Ankle ulcer (HCC)    "I've had them on both sides" (11/06/2017)  . DVT (deep venous thrombosis) (HCC) 10/30/2017   RLE  . Grand mal seizure [facility])    LAST SEIZURE 12/25/1991; on daily RX since" (11/06/2017)  . Varicose veins     Past Surgical History Past Surgical History:  Procedure Laterality Date  . CLOSED REDUCTION FINGER WITH PERCUTANEOUS PINNING Left 08/27/2017    Procedure: CLOSED REDUCTION WITH PERCUTANEOUS PINNING LEFT SMALL FINGER;  Surgeon: Betha Loa, MD;  Location: [facility];  Service: Orthopedics;  Laterality: Left;  . ENDOVENOUS ABLATION SAPHENOUS VEIN W/ LASER Left 06-25-2013   left anterior saphenous vein and stab phlebectomy 10-20 incisions left leg  by Gretta Began MD  . ENDOVENOUS ABLATION SAPHENOUS VEIN W/ LASER Right 09/2013  . FRACTURE SURGERY    . LOWER EXTREMITY ANGIOGRAPHY N/A 11/08/2017   Procedure: LOWER EXTREMITY ANGIOGRAPHY - Lysis Recheck;  Surgeon: Fransisco Hertz, MD;  Location: [facility] INVASIVE CV LAB;  Service: Cardiovascular;  Laterality: N/A;  . LOWER EXTREMITY VENOGRAPHY Right 11/07/2017   Procedure: LOWER EXTREMITY VENOGRAPHYwith thrombolysis;  Surgeon: Fransisco Hertz, MD;  Location: [facility] INVASIVE CV LAB;  Service: Cardiovascular;  Laterality: Right;  . PERIPHERAL VASCULAR BALLOON ANGIOPLASTY Right 11/08/2017   Procedure: PERIPHERAL VASCULAR BALLOON ANGIOPLASTY;  Surgeon: Fransisco Hertz, MD;  Location: [facility] INVASIVE CV LAB;  Service: Cardiovascular;  Laterality: Right;  LE venous system    Family History Family History  Problem Relation Age of Onset  . Heart disease Father   . Diabetes Paternal Grandmother      Social History  reports that he has never smoked. He has never used smokeless tobacco. He reports that he does not drink alcohol or use drugs.  Medications  Current Outpatient Medications:  .  phenytoin (DILANTIN) 100 MG ER capsule, Take 400 mg daily by mouth. , Disp: , Rfl:  .  warfarin (COUMADIN) 5 MG tablet, Take 1 tablet (5 mg total) daily at 6 PM by mouth. (Patient taking differently: Take 3 mg by mouth daily at 6 PM. ), Disp: 30 tablet, Rfl: 2 .  silver sulfADIAZINE (SILVADENE) 1 % cream, Apply to affected area daily (Patient not taking: Reported on 07/09/2018), Disp: 50 g, Rfl: 1  Allergies Patient has no known allergies.  Review of Systems Review of Systems - Oncology ROS negative other  than leg swelling   Physical Exam  Vitals Wt Readings from Last 3 Encounters:  03/23/19 267 lb 9.6 oz (121.4 kg)  07/09/18 258 lb (117 kg)  04/02/18 256 lb (116.1 kg)   Temp Readings from Last 3 Encounters:  03/23/19 98.8 F (37.1 C) (Oral)  07/09/18 98.3 F (36.8 C) (Oral)  04/02/18 98.6 F (37 C) (Oral)   BP Readings from Last 3 Encounters:  03/23/19 119/81  07/09/18 120/79  04/02/18 101/74   Pulse Readings from Last 3 Encounters:  03/23/19 88  07/09/18 85  04/02/18 92   Constitutional: Well-developed, well-nourished,  and in no distress.   HENT: Head: Normocephalic and atraumatic.  Mouth/Throat: No oropharyngeal exudate. Mucosa moist. Eyes: Pupils are equal, round, and reactive to light. Conjunctivae are normal. No scleral icterus.  Neck: Normal range of motion. Neck supple. No JVD present.  Cardiovascular: Normal rate, regular rhythm and normal heart sounds.  Exam reveals no gallop and no friction rub.   No murmur heard. Pulmonary/Chest: Effort normal and breath sounds normal. No respiratory distress. No wheezes.No rales.  Abdominal: Soft. Bowel sounds are normal. No distension. There is no tenderness. There is no guarding.  Musculoskeletal: Varicosities noted.  Compression hose in place.   Lymphadenopathy: No cervical,axillary or supraclavicular adenopathy.  Neurological: Alert and oriented to person, place, and time. No cranial nerve deficit.  Skin: Skin is warm and dry. No rash noted. No erythema. No pallor.  Psychiatric: Affect and judgment normal.   Labs Appointment on 03/23/2019  Component Date Value Ref Range Status  . WBC Count 03/23/2019 7.2  4.0 - 10.5 K/uL Final  . RBC 03/23/2019 5.22  4.22 - 5.81 MIL/uL Final  . Hemoglobin 03/23/2019 14.0  13.0 - 17.0 g/dL Final  . HCT 60/45/4098 43.9  39.0 - 52.0 % Final  . MCV 03/23/2019 84.1  80.0 - 100.0 fL Final  . MCH 03/23/2019 26.8  26.0 - 34.0 pg Final  . MCHC 03/23/2019 31.9  30.0 - 36.0 g/dL Final  . RDW  11/91/4782 15.6* 11.5 - 15.5 % Final  . Platelet Count 03/23/2019 269  150 - 400 K/uL Final  . nRBC 03/23/2019 0.0  0.0 - 0.2 % Final  . Neutrophils Relative % 03/23/2019 58  % Final  . Neutro Abs 03/23/2019 4.2  1.7 - 7.7 K/uL Final  . Lymphocytes Relative 03/23/2019 29  % Final  . Lymphs Abs 03/23/2019 2.1  0.7 - 4.0 K/uL Final  . Monocytes Relative 03/23/2019 7  % Final  . Monocytes Absolute 03/23/2019 0.5  0.1 - 1.0 K/uL Final  . Eosinophils Relative 03/23/2019 5  % Final  . Eosinophils Absolute 03/23/2019 0.3  0.0 - 0.5 K/uL Final  . Basophils Relative 03/23/2019 1  % Final  . Basophils Absolute 03/23/2019 0.1  0.0 - 0.1 K/uL Final  . Immature Granulocytes 03/23/2019 0  % Final  . Abs Immature Granulocytes 03/23/2019 0.03  0.00 - 0.07 K/uL Final   Performed at [facility], [address] [place], [place] [address]  . Sodium 03/23/2019 141  135 - 145 mmol/L Final  . Potassium 03/23/2019 4.4  3.5 - 5.1 mmol/L Final  . Chloride 03/23/2019 109  98 - 111 mmol/L Final  . CO2 03/23/2019 24  22 - 32 mmol/L Final  . Glucose, Bld 03/23/2019 101* 70 - 99 mg/dL Final  . BUN 30/86/5784 19  6 - 20 mg/dL Final  . Creatinine 69/62/9528 0.87  0.61 - 1.24 mg/dL Final  . Calcium 41/32/4401 8.8* 8.9 - 10.3 mg/dL Final  . Total Protein 03/23/2019 8.4* 6.5 - 8.1 g/dL Final  . Albumin 02/72/5366 3.5  3.5 - 5.0 g/dL Final  . AST 44/02/4741 22  15 - 41 U/L Final  . ALT 03/23/2019 23  0 - 44 U/L Final  . Alkaline Phosphatase 03/23/2019 131* 38 - 126 U/L Final  . Total Bilirubin 03/23/2019 0.3  0.3 - 1.2 mg/dL Final  . GFR, Est Non Af Am 03/23/2019 >60  >60 mL/min Final  . GFR, Est AFR Am 03/23/2019 >60  >60 mL/min Final  . Anion  gap 03/23/2019 8  5 - 15 Final   Performed at [facility], [address] [place], [place] [address]  . LDH 03/23/2019 167  98 - 192 U/L Final   Performed at [facility], [address],  [place], [place] [address]  . Ferritin 03/23/2019 86  24 - 336 ng/mL Final   Performed at [facility], [address] [place], [place] [address]  . Iron 03/23/2019 55  42 - 163 ug/dL Final  . TIBC 87/86/7672 290  202 - 409 ug/dL Final  . Saturation Ratios 03/23/2019 19* 20 - 55 % Final  . UIBC 03/23/2019 235  117 - 376 ug/dL Final   Performed at [facility], [address], [place], [place] [address]     Pathology Orders Placed This Encounter  Procedures  . CT CHEST W CONTRAST    Standing Status:   Future    Standing Expiration Date:   03/22/2020    Order Specific Question:   If indicated for the ordered procedure, I authorize the administration of contrast media per Radiology protocol    Answer:   Yes    Order Specific Question:   Preferred imaging location?    Answer:   [facility]    Order Specific Question:   Radiology Contrast Protocol - do NOT remove file path    Answer:   [email or web address]  . CT ABDOMEN PELVIS W CONTRAST    Standing Status:   Future    Standing Expiration Date:   03/22/2020    Order Specific Question:   If indicated for the ordered procedure, I authorize the administration of contrast media per Radiology protocol    Answer:   Yes    Order Specific Question:   Preferred imaging location?    Answer:   [facility]    Order Specific Question:   Is Oral Contrast requested for this exam?    Answer:   Yes, Per Radiology protocol    Order Specific Question:   Radiology Contrast Protocol - do NOT remove file path    Answer:   \\[email or web address]  . CBC with Differential (Cancer Center Only)    Standing Status:   Future    Number of Occurrences:   1    Standing Expiration Date:   03/22/2020  . CMP (Cancer Center only)    Standing Status:   Future    Number of Occurrences:   1    Standing Expiration Date:   03/22/2020  . Lactate dehydrogenase (LDH)     Standing Status:   Future    Number of Occurrences:   1    Standing Expiration Date:   03/22/2020  . Ferritin    Standing Status:   Future    Number of Occurrences:   1    Standing Expiration Date:   03/22/2020  . Iron and TIBC    Standing Status:   Future    Number of Occurrences:   1    Standing Expiration Date:   03/22/2020  . SPEP with reflex to IFE    Standing Status:   Future    Number of Occurrences:   1    Standing Expiration Date:   03/22/2020  . ANA, IFA (with reflex)    Standing Status:   Future    Number of Occurrences:   1    Standing Expiration Date:   03/22/2020  . Rheumatoid factor  Standing Status:   Future    Number of Occurrences:   1    Standing Expiration Date:   03/22/2020  . Prothrombin gene mutation*    Standing Status:   Future    Number of Occurrences:   1    Standing Expiration Date:   03/22/2020  . Lupus anticoagulant panel*    Standing Status:   Future    Number of Occurrences:   1    Standing Expiration Date:   03/22/2020  . Factor 5 Leiden*    Standing Status:   Future    Number of Occurrences:   1    Standing Expiration Date:   03/22/2020  . Beta-2-glycoprotein i abs, IgG/M/A    Standing Status:   Future    Number of Occurrences:   1    Standing Expiration Date:   03/22/2020       Ahmed Prima MD

## 2019-03-23 NOTE — Telephone Encounter (Signed)
Scheduled appt per 3/30 los.  Gave patient contrast and the number to central radiology.  Printed calendar and avs.

## 2019-03-24 ENCOUNTER — Telehealth: Payer: Self-pay

## 2019-03-24 ENCOUNTER — Other Ambulatory Visit: Payer: Self-pay | Admitting: Internal Medicine

## 2019-03-24 ENCOUNTER — Telehealth: Payer: Self-pay | Admitting: Internal Medicine

## 2019-03-24 DIAGNOSIS — I82511 Chronic embolism and thrombosis of right femoral vein: Secondary | ICD-10-CM

## 2019-03-24 DIAGNOSIS — M25551 Pain in right hip: Secondary | ICD-10-CM

## 2019-03-24 DIAGNOSIS — I82411 Acute embolism and thrombosis of right femoral vein: Secondary | ICD-10-CM

## 2019-03-24 LAB — PROTEIN ELECTROPHORESIS, SERUM, WITH REFLEX
A/G Ratio: 0.8 (ref 0.7–1.7)
ALPHA-1-GLOBULIN: 0.2 g/dL (ref 0.0–0.4)
Albumin ELP: 3.5 g/dL (ref 2.9–4.4)
Alpha-2-Globulin: 0.8 g/dL (ref 0.4–1.0)
Beta Globulin: 1.2 g/dL (ref 0.7–1.3)
GLOBULIN, TOTAL: 4.3 g/dL — AB (ref 2.2–3.9)
Gamma Globulin: 2.1 g/dL — ABNORMAL HIGH (ref 0.4–1.8)
TOTAL PROTEIN ELP: 7.8 g/dL (ref 6.0–8.5)

## 2019-03-24 LAB — BETA-2-GLYCOPROTEIN I ABS, IGG/M/A
Beta-2 Glyco I IgG: <9 GPI IgG units (ref 0–20)
Beta-2-Glycoprotein I IgA: <9 GPI IgA units (ref 0–25)
Beta-2-Glycoprotein I IgM: <9 GPI IgM units (ref 0–32)

## 2019-03-24 LAB — RHEUMATOID FACTOR

## 2019-03-24 LAB — ANTINUCLEAR ANTIBODIES, IFA: ANTINUCLEAR ANTIBODIES, IFA: NEGATIVE

## 2019-03-24 NOTE — Telephone Encounter (Signed)
Provider entered order for doppler to be performed 4/6. Per vascular lab [facility] doing only stat exams try calling [facility] or [facility]. Both facilities are scheduling 2 months out.  Spoke with provider and order changed to stat. Spoke with patient and patient wants to push test a little ways out and was made aware that pushing the test out would mean waiting approximately two months. Patient would like to know if waiting is ok or test needed now. Spoke with desk nurse regarding patient's request. Desk nurse/provider will get back to me.

## 2019-03-24 NOTE — Telephone Encounter (Signed)
Patient called and requested to delay having CT and dopplers done. He would like to wait until May. Per Dr. Melton Alar, if patient wants to wait to have imaging and dopplers done then it is ok to wait. Patient aware that he will receive a call from Central Scheduling to schedule appointments. Patient instructed to call the office if he has not received a call to schedule by late April. Patient verbalized understanding.

## 2019-03-25 ENCOUNTER — Telehealth: Payer: Self-pay | Admitting: Internal Medicine

## 2019-03-25 LAB — DRVVT MIX: dRVVT Mix: 41.6 s (ref 0.0–47.0)

## 2019-03-25 LAB — LUPUS ANTICOAGULANT PANEL
DRVVT: 50.9 s — ABNORMAL HIGH (ref 0.0–47.0)
PTT LA: 34 s (ref 0.0–51.9)

## 2019-03-25 NOTE — Telephone Encounter (Signed)
Per provider ok for patient to wait until May for doppler. See nurses note also. Left message for patient re doppler at [facility] 5/4 @ 9 am. Patient to get copy of doppler appointment when he comes back in for f/u 4/13.

## 2019-03-25 NOTE — Telephone Encounter (Signed)
Erroneous encounter. See previous notes.

## 2019-03-26 LAB — FACTOR 5 LEIDEN

## 2019-03-27 LAB — PROTHROMBIN GENE MUTATION

## 2019-04-03 ENCOUNTER — Telehealth: Payer: Self-pay | Admitting: Internal Medicine

## 2019-04-03 NOTE — Telephone Encounter (Signed)
[facility] out of office 4/13. Moved appointment to 4/17. Left message.

## 2019-04-06 ENCOUNTER — Ambulatory Visit: Payer: BLUE CROSS/BLUE SHIELD | Admitting: Internal Medicine

## 2019-04-10 ENCOUNTER — Inpatient Hospital Stay: Payer: BLUE CROSS/BLUE SHIELD | Attending: Internal Medicine | Admitting: Internal Medicine

## 2019-04-10 DIAGNOSIS — I82511 Chronic embolism and thrombosis of right femoral vein: Secondary | ICD-10-CM | POA: Diagnosis not present

## 2019-04-10 DIAGNOSIS — Z7901 Long term (current) use of anticoagulants: Secondary | ICD-10-CM

## 2019-04-10 NOTE — Progress Notes (Signed)
Virtual Visit via Telephone Note  I connected with Patrick Ingram on 04/10/19 at 10:10 AM EDT by telephone and verified that I am speaking with the correct person using two identifiers.   I discussed the limitations, risks, security and privacy concerns of performing an evaluation and management service by telephone and the availability of in person appointments. I also discussed with the patient that there may be a patient responsible charge related to this service. The patient expressed understanding and agreed to proceed.  Interval history:  Historical data obtained from note dated 03/23/2019.  53 year old male referred for evaluation due to DVT.  Pt reportedly diagnosed with DVT in 2019.  He has been on coumadin since that time.  He reports he had vein stripping done 10 years ago.  He had a Lytic catheter placement R venous system, Angiojet R iliofemoral vein, PTA R CFV, FV and PV for sx R iliofem DVT.  He was last seen by Dr. Imogene Burnhen in 06/2018 who recommended:   Proximal treated segment of R leg appears patent.  There appears to be no obvious need for femoral vein stenting at this point.  Unclear if patient ever went to see Hematology.  Will try to get him back in to get his thrombophilia evaluated to see his can come off his anticoagulation, given the seizure medication interactions with anticoagulation choice.    Pt reports father had heart attack but he lives in [place] and treated himself with foxglove.  He denies any family history of DVT.    Pt denies recent travel other than every 1 to 2 years.  He denies smoking or ETOH use.    He is currently on coumadin 3 mg daily.  He is followed by his PCP for monitoring.  He reports seizure history diagnosed in 1993 with no clear etiology.  He is on Dilantin.    Observations/Objective:Review of labs done 03/23/2019.     Assessment and Plan:1.  Right LE DVT.  53 year old male referred for evaluation due to DVT.  Pt reportedly diagnosed with DVT  in 2019.  He has been on coumadin since that time.  He reports he had vein stripping done 10 years ago.  He had a Lytic catheter placement R venous system, Angiojet R iliofemoral vein, PTA R CFV, FV and PV for sx R iliofem DVT.  He was last seen by Dr. Imogene Burnhen in 06/2018 who recommended:   Proximal treated segment of R leg appears patent.  There appears to be no obvious need for femoral vein stenting at this point.  Unclear if patient ever went to see Hematology.  Will try to get him back in to get his thrombophilia evaluated to see his can come off his anticoagulation, given the seizure medication interactions with anticoagulation choice.    Pt reports father had heart attack but he lives in [place] and treated himself with foxglove.  He denies any family history of DVT.    Pt denies recent travel other than every 1 to 2 years.  He denies smoking or ETOH use.    He is currently on coumadin 3 mg daily.  He is followed by his PCP for monitoring.  He reports seizure history diagnosed in 1993 with no clear etiology.  He is on Dilantin.    Labs done  03/23/2019 reviewed and showed WBC 7.2 HB 14 plts 269,000.  Chemistries WNL with K+ 4.4 Cr 0.87 normal LFTs.  LDH 167.  Ferritin 86.  He  has normal Factor V Leiden, PT gene mutation, lupus anticoagulant.  He has negative SPEP, ANA, RF.    Last doppler evaluation was done7/17/2019 that showed:  Final Interpretation: Right: No evidence of acute right lower extremity deep vein thrombosis. Contracted right popliteal vein appearing chronic. The peroneal vein not visualized. Non compressible varicose veins in the distal calf that appear to be near but not in the deep calf vein age undetermined. The greater saphenous vein in the calf appears partially incompressible. No thrombus visualized in the distal external iliac vein. Left: There is no evidence of acute deep vein thrombosis in the lower extremity. Thickened greater saphenous vein is partially  compressible.  Pt is set up for CT CAP for evaluation for occult malignancy.  He is set up for bilateral LE doppler for interval evaluation.  He is currently on coumadin and should continue coumadin monitoring through PCP.  Pt reports history of vein stripping in the past.  Once scans and doppler reviewed, pt will be set up for phone visit to go over results.  He should notify the office if any problems prior to his next visit.    2.  Family history of CAD.  Pt reports father had heart attack but he lives in [place] and treated himself with foxglove.  Foxglove contains chemicals from which the prescription medication digoxin (Lanoxin) is made. He denies any family history of DVT.   3.  Chronic venous insufficiency.  Pt reports he has vein stripping done in the past.  He wears compression hose.    4  Seizure.  Pt reports last seizure was in 1993.  He is on Dilantin.  Follow-up with PCP or neurology for monitoring.    5.  Health maintenance.  GI follow-up as recommended.     Follow Up Instructions: Pt will have phone visit follow-up in 04/2019 to go over doppler and CT results.      I discussed the assessment and treatment plan with the patient. The patient was provided an opportunity to ask questions and all were answered. The patient agreed with the plan and demonstrated an understanding of the instructions.   The patient was advised to call back or seek an in-person evaluation if the symptoms worsen or if the condition fails to improve as anticipated.  I provided 15 minutes of non-face-to-face time during this encounter.   Ahmed Prima, MD

## 2019-04-13 ENCOUNTER — Telehealth: Payer: Self-pay | Admitting: Internal Medicine

## 2019-04-13 NOTE — Telephone Encounter (Signed)
CALLED regarding 5/7

## 2019-04-14 ENCOUNTER — Telehealth: Payer: Self-pay | Admitting: *Deleted

## 2019-04-14 NOTE — Telephone Encounter (Signed)
Pt returned call stating that he is fine with Phone visit on 04/30/19 with Dr. Melton Alar.   Pt also aware of scan and doppler appts on 04/27/19.

## 2019-04-27 ENCOUNTER — Ambulatory Visit (HOSPITAL_COMMUNITY): Admission: RE | Admit: 2019-04-27 | Payer: BLUE CROSS/BLUE SHIELD | Source: Ambulatory Visit

## 2019-04-27 ENCOUNTER — Ambulatory Visit (HOSPITAL_BASED_OUTPATIENT_CLINIC_OR_DEPARTMENT_OTHER)
Admission: RE | Admit: 2019-04-27 | Discharge: 2019-04-27 | Disposition: A | Discharge status: Home / Self Care | Payer: BLUE CROSS/BLUE SHIELD | Source: Ambulatory Visit | Attending: Internal Medicine | Admitting: Internal Medicine

## 2019-04-27 ENCOUNTER — Ambulatory Visit (HOSPITAL_COMMUNITY)
Admission: RE | Admit: 2019-04-27 | Discharge: 2019-04-27 | Disposition: A | Discharge status: Home / Self Care | Payer: BLUE CROSS/BLUE SHIELD | Source: Ambulatory Visit | Attending: Internal Medicine | Admitting: Internal Medicine

## 2019-04-27 ENCOUNTER — Encounter (HOSPITAL_COMMUNITY): Payer: Self-pay

## 2019-04-27 ENCOUNTER — Encounter (HOSPITAL_COMMUNITY): Payer: BLUE CROSS/BLUE SHIELD

## 2019-04-27 ENCOUNTER — Other Ambulatory Visit: Payer: Self-pay

## 2019-04-27 DIAGNOSIS — I82511 Chronic embolism and thrombosis of right femoral vein: Secondary | ICD-10-CM | POA: Diagnosis not present

## 2019-04-27 DIAGNOSIS — I82411 Acute embolism and thrombosis of right femoral vein: Secondary | ICD-10-CM

## 2019-04-27 DIAGNOSIS — R634 Abnormal weight loss: Secondary | ICD-10-CM | POA: Insufficient documentation

## 2019-04-27 DIAGNOSIS — M25551 Pain in right hip: Secondary | ICD-10-CM

## 2019-04-27 DIAGNOSIS — R0602 Shortness of breath: Secondary | ICD-10-CM | POA: Diagnosis present

## 2019-04-27 DIAGNOSIS — I83813 Varicose veins of bilateral lower extremities with pain: Secondary | ICD-10-CM

## 2019-04-27 MED ORDER — IOHEXOL 300 MG/ML  SOLN
100.0000 mL | Freq: Once | INTRAMUSCULAR | Status: AC | PRN
  Administered 2019-04-27: 10:00:00 100 mL via INTRAVENOUS

## 2019-04-27 NOTE — Progress Notes (Signed)
RLE venous duplex       has been completed. Preliminary results can be found under CV proc through chart review. Hubert Raatz, BS, RDMS, RVT   

## 2019-04-30 ENCOUNTER — Inpatient Hospital Stay: Payer: BLUE CROSS/BLUE SHIELD | Attending: Internal Medicine | Admitting: Internal Medicine

## 2019-04-30 DIAGNOSIS — I82511 Chronic embolism and thrombosis of right femoral vein: Secondary | ICD-10-CM | POA: Diagnosis not present

## 2019-04-30 NOTE — Progress Notes (Signed)
Virtual Visit via Telephone Note  I connected with Patrick Ingram on 04/30/19 at 10:10 AM EDT by telephone and verified that I am speaking with the correct person using two identifiers.   I discussed the limitations, risks, security and privacy concerns of performing an evaluation and management service by telephone and the availability of in person appointments. I also discussed with the patient that there may be a patient responsible charge related to this service. The patient expressed understanding and agreed to proceed.  Historical data obtained from note dated 03/23/2019.  53 year old male referred for evaluation due to DVT.  Pt reportedly diagnosed with DVT in 2019.  He has been on coumadin since that time.  He reports he had vein stripping done 10 years ago.  He had a Lytic catheter placement R venous system, Angiojet R iliofemoral vein, PTA R CFV, FV and PV for sx R iliofem DVT.  He was last seen by Dr. Imogene Burn in 06/2018 who recommended:   Proximal treated segment of R leg appears patent.  There appears to be no obvious need for femoral vein stenting at this point.  Unclear if patient ever went to see Hematology.  Will try to get him back in to get his thrombophilia evaluated to see his can come off his anticoagulation, given the seizure medication interactions with anticoagulation choice.    Pt reports father had heart attack but he lives in [place] and treated himself with foxglove.  He denies any family history of DVT.    Pt denies recent travel other than every 1 to 2 years.  He denies smoking or ETOH use.    He was  on coumadin 3 mg daily.  He is followed by his PCP for monitoring.  He reports seizure history diagnosed in 1993 with no clear etiology.  He is on Dilantin.    Observations/Objective:Review of scans and doppler done 04/27/2019   Assessment and Plan:1.  Right LE DVT.  53 year old male referred for evaluation due to DVT.  Pt reportedly diagnosed with DVT in 2019.  He has  been on coumadin since that time.  He reports he had vein stripping done 10 years ago.  He had a Lytic catheter placement R venous system, Angiojet R iliofemoral vein, PTA R CFV, FV and PV for sx R iliofem DVT.  He was last seen by Dr. Imogene Burn in 06/2018 who recommended:   Proximal treated segment of R leg appears patent.  There appears to be no obvious need for femoral vein stenting at this point.  Unclear if patient ever went to see Hematology.  Will try to get him back in to get his thrombophilia evaluated to see his can come off his anticoagulation, given the seizure medication interactions with anticoagulation choice.    Pt reports father had heart attack but he lives in [place] and treated himself with foxglove.  He denies any family history of DVT.    Pt denies recent travel other than every 1 to 2 years.  He denies smoking or ETOH use.    He was  on coumadin 3 mg daily.  He is followed by his PCP for monitoring.  He reports seizure history diagnosed in 1993 with no clear etiology.  He is on Dilantin.    Labs done  03/23/2019 showed WBC 7.2 HB 14 plts 269,000.  Chemistries WNL with K+ 4.4 Cr 0.87 normal LFTs.  LDH 167.  Ferritin 86.  He has normal Factor V Leiden,  PT gene mutation, lupus anticoagulant.  He has negative SPEP, ANA, RF.    Doppler evaluation was done7/17/2019 showed:  Final Interpretation: Right: No evidence of acute right lower extremity deep vein thrombosis. Contracted right popliteal vein appearing chronic. The peroneal vein not visualized. Non compressible varicose veins in the distal calf that appear to be near but not in the deep calf vein age undetermined. The greater saphenous vein in the calf appears partially incompressible. No thrombus visualized in the distal external iliac vein. Left: There is no evidence of acute deep vein thrombosis in the lower extremity. Thickened greater saphenous vein is partially compressible.  CT CAP done 04/27/2019 reviewed and showed   IMPRESSION: No significant abnormality of the chest, abdomen, or pelvis to explain weight loss or abdominal pain. No evidence of malignancy.    Right LE doppler done 04/27/2019 reviewed and showed  Summary: Right: No Evidence of Acute deep vein thrombosis. Chronic stranding appearance is noted in the right popliteal vein and superficially in the great saphenous vein in the calf and varicosities.  Pt reports history of vein stripping in the past.  I discussed with pt usual recommendations are for 6 months of anticoagulation.  Due to recent doppler evaluations in 06/2018 and 04/2019 that showed no clear evidence of thrombosis, it is reasonable to give pt a trial off anticoagulation.  Pt will discontinue coumadin.  He is advised to notify office if any SOB or LE swelling noted.  Pt will follow-up in 08/2019 for evaluation off coumadin.    2.  Family history of CAD.  Pt reports father had heart attack but he lives in [place] and treated himself with foxglove.  Foxglove contains chemicals from which the prescription medication digoxin (Lanoxin) is made. He denies any family history of DVT.   3.  Chronic venous insufficiency.  Pt reports he has vein stripping done in the past.  He wears compression hose.    4  Seizure.  Pt reports last seizure was in 1993.  He is on Dilantin.  Follow-up with PCP or neurology for monitoring.    5.  Health maintenance.  GI follow-up as recommended.    Follow Up Instructions: Follow-up 08/2019.    I discussed the assessment and treatment plan with the patient. The patient was provided an opportunity to ask questions and all were answered. The patient agreed with the plan and demonstrated an understanding of the instructions.   The patient was advised to call back or seek an in-person evaluation if the symptoms worsen or if the condition fails to improve as anticipated.  I provided 15 minutes of non-face-to-face time during this encounter.   Ahmed PrimaVetta Dedria Endres, MD

## 2019-08-10 ENCOUNTER — Telehealth: Payer: Self-pay | Admitting: Oncology

## 2019-08-10 NOTE — Telephone Encounter (Signed)
[facility] transfer to [facility]. Confirmed 9/9 appointment with patient.

## 2019-09-02 ENCOUNTER — Inpatient Hospital Stay: Payer: BC Managed Care – PPO

## 2019-09-02 ENCOUNTER — Inpatient Hospital Stay: Payer: BC Managed Care – PPO | Attending: Internal Medicine | Admitting: Adult Health

## 2019-09-02 ENCOUNTER — Encounter: Payer: Self-pay | Admitting: Adult Health

## 2019-09-02 ENCOUNTER — Other Ambulatory Visit: Payer: Self-pay | Admitting: Adult Health

## 2019-09-02 ENCOUNTER — Other Ambulatory Visit: Payer: Self-pay

## 2019-09-02 VITALS — BP 122/82 | HR 80 | Temp 97.8°F | Resp 18 | Ht 74.0 in | Wt 267.1 lb

## 2019-09-02 DIAGNOSIS — Z86718 Personal history of other venous thrombosis and embolism: Secondary | ICD-10-CM | POA: Diagnosis present

## 2019-09-02 DIAGNOSIS — Z79899 Other long term (current) drug therapy: Secondary | ICD-10-CM | POA: Diagnosis not present

## 2019-09-02 DIAGNOSIS — Z7901 Long term (current) use of anticoagulants: Secondary | ICD-10-CM | POA: Diagnosis not present

## 2019-09-02 DIAGNOSIS — G40909 Epilepsy, unspecified, not intractable, without status epilepticus: Secondary | ICD-10-CM | POA: Diagnosis not present

## 2019-09-02 DIAGNOSIS — I82411 Acute embolism and thrombosis of right femoral vein: Secondary | ICD-10-CM

## 2019-09-02 LAB — CBC WITH DIFFERENTIAL (CANCER CENTER ONLY)
Abs Immature Granulocytes: 0.03 10*3/uL (ref 0.00–0.07)
Basophils Absolute: 0.1 10*3/uL (ref 0.0–0.1)
Basophils Relative: 1 %
Eosinophils Absolute: 0.3 10*3/uL (ref 0.0–0.5)
Eosinophils Relative: 4 %
HCT: 41.7 % (ref 39.0–52.0)
Hemoglobin: 13.1 g/dL (ref 13.0–17.0)
Immature Granulocytes: 0 %
Lymphocytes Relative: 31 %
Lymphs Abs: 2.3 10*3/uL (ref 0.7–4.0)
MCH: 26.7 pg (ref 26.0–34.0)
MCHC: 31.4 g/dL (ref 30.0–36.0)
MCV: 84.9 fL (ref 80.0–100.0)
Monocytes Absolute: 0.6 10*3/uL (ref 0.1–1.0)
Monocytes Relative: 8 %
Neutro Abs: 4.2 10*3/uL (ref 1.7–7.7)
Neutrophils Relative %: 56 %
Platelet Count: 263 10*3/uL (ref 150–400)
RBC: 4.91 MIL/uL (ref 4.22–5.81)
RDW: 15.1 % (ref 11.5–15.5)
WBC Count: 7.5 10*3/uL (ref 4.0–10.5)
nRBC: 0 % (ref 0.0–0.2)

## 2019-09-02 LAB — RETICULOCYTES
Immature Retic Fract: 14.1 % (ref 2.3–15.9)
RBC.: 4.86 MIL/uL (ref 4.22–5.81)
Retic Count, Absolute: 84.1 10*3/uL (ref 19.0–186.0)
Retic Ct Pct: 1.7 % (ref 0.4–3.1)

## 2019-09-02 LAB — IRON AND TIBC
Iron: 57 ug/dL (ref 42–163)
Saturation Ratios: 20 % (ref 20–55)
TIBC: 281 ug/dL (ref 202–409)
UIBC: 224 ug/dL (ref 117–376)

## 2019-09-02 LAB — D-DIMER, QUANTITATIVE: D-Dimer, Quant: 2.29 ug/mL-FEU — ABNORMAL HIGH (ref 0.00–0.50)

## 2019-09-02 LAB — FERRITIN: Ferritin: 104 ng/mL (ref 24–336)

## 2019-09-02 NOTE — Progress Notes (Addendum)
[place]  [phone] [phone]     ID: Milik Gilreath DOB: November 08, 1966  MR#: [id number]  OFB#:[id number]  Patient Care Team: Kristen Loader, FNP as PCP - General (Family Medicine) Via, Lennette Bihari, MD as Referring Physician (Family Medicine) Scot Dock, NP OTHER MD:  CHIEF COMPLAINT: DVT  CURRENT TREATMENT: Warfarin   HISTORY OF CURRENT ILLNESS: Kwame is a 53 year old male with h/o chronic venous insufficiency, seizure in 1993, and heart disease,  who was diagnosed with DVT in 10/2017 (a few months after finger surgery) in the distal right external iliac vein, common femoral, and popliteal vein.  He underwent lower extremity angiography and peripheral vascular balloon angioplasty at that time with vascular surgery.  He was discharged on Lovenox bridge to Coumadin.  He was referred to hematology for evaluation by vascular surgery.  He did undergo doppler in 06/2018 that showed no evidence of acute DVT, however on the right, + chronic popliteal DVT, +SVT in GSV and calf, left leg doppler was negative. He was evaluated by Dr. Walden Field in 02/2019 and lab work up was negative.  CT chest/abdomen/pevlis in 04/2019 was negative for occult malignancy.  His coumadin was discontinued in 04/2019 by Dr. Walden Field.   The patient's subsequent history is as detailed below.  INTERVAL HISTORY: Fines is here for evaluation of his h/o DVT.  During his last visit with Dr. Walden Field, his coumadin was discontinued.  He has done well since stopping and has had no further swelling in his right leg.  He has chronic venous insufficiency.  He wears compression stockings for his chronic lower extremity swelling.    REVIEW OF SYSTEMS:  Ryheem is feeling well today.  He has no fever or chills.  He is without headaches or vision changes.  He has no cough, shortness of breath, chest pain or palpitations.  He is without nausea, vomiting, bowel/bladder changes.  A detailed ROS was otherwise non  contributory.    PAST MEDICAL HISTORY: Past Medical History:  Diagnosis Date   Ankle ulcer ([place])    "I've had them on both sides" (11/06/2017)   DVT (deep venous thrombosis) ([place]) 10/30/2017   RLE   Grand mal seizure ([place])    LAST SEIZURE 12/25/1991; on daily RX since" (11/06/2017)   Varicose veins     PAST SURGICAL HISTORY: Past Surgical History:  Procedure Laterality Date   CLOSED REDUCTION FINGER WITH PERCUTANEOUS PINNING Left 08/27/2017   Procedure: CLOSED REDUCTION WITH PERCUTANEOUS PINNING LEFT SMALL FINGER;  Surgeon: Leanora Cover, MD;  Location: [place];  Service: Orthopedics;  Laterality: Left;   ENDOVENOUS ABLATION SAPHENOUS VEIN W/ LASER Left 06-25-2013   left anterior saphenous vein and stab phlebectomy 10-20 incisions left leg  by Curt Jews MD   ENDOVENOUS ABLATION SAPHENOUS VEIN W/ LASER Right 09/2013   FRACTURE SURGERY     LOWER EXTREMITY ANGIOGRAPHY N/A 11/08/2017   Procedure: LOWER EXTREMITY ANGIOGRAPHY - Lysis Recheck;  Surgeon: Conrad Louviers, MD;  Location: [facility];  Service: Cardiovascular;  Laterality: N/A;   LOWER EXTREMITY VENOGRAPHY Right 11/07/2017   Procedure: LOWER EXTREMITY VENOGRAPHYwith thrombolysis;  Surgeon: Conrad Orocovis, MD;  Location: [facility];  Service: Cardiovascular;  Laterality: Right;   PERIPHERAL VASCULAR BALLOON ANGIOPLASTY Right 11/08/2017   Procedure: PERIPHERAL VASCULAR BALLOON ANGIOPLASTY;  Surgeon: Conrad , MD;  Location: [facility];  Service: Cardiovascular;  Laterality: Right;  LE venous system    FAMILY HISTORY Family  History  Problem Relation Age of Onset   Heart disease Father    Diabetes Paternal Grandmother      SOCIAL HISTORY: Glenda is married and lives with his wife and son who is 54 years old in [place], [place].  He works as an Chief Financial Officer at [employer].  He is currently working from home due to [employer].  He exercises regularly with the assistance of his wife.        ADVANCED DIRECTIVES:    HEALTH MAINTENANCE: Social History   Tobacco Use   Smoking status: Never Smoker   Smokeless tobacco: Never Used  Substance Use Topics   Alcohol use: No   Drug use: No     Colonoscopy: done in 2019   No Known Allergies  Current Outpatient Medications  Medication Sig Dispense Refill   phenytoin (DILANTIN) 100 MG ER capsule Take 400 mg daily by mouth.      No current facility-administered medications for this visit.     OBJECTIVE:  Vitals:   09/02/19 1107  BP: 122/82  Pulse: 80  Resp: 18  Temp: 97.8 F (36.6 C)  SpO2: 99%     Body mass index is 34.29 kg/m.   Wt Readings from Last 3 Encounters:  09/02/19 267 lb 1.6 oz (121.2 kg)  03/23/19 267 lb 9.6 oz (121.4 kg)  07/09/18 258 lb (117 kg)  ECOG FS:0 - Asymptomatic GENERAL: Patient is a well appearing male in no acute distress HEENT:  Sclerae anicteric.  Oropharynx clear and moist. No ulcerations or evidence of oropharyngeal candidiasis. Neck is supple.  NODES:  No cervical, supraclavicular, or axillary lymphadenopathy palpated.  LUNGS:  Clear to auscultation bilaterally.  No wheezes or rhonchi. HEART:  Regular rate and rhythm. No murmur appreciated. ABDOMEN:  Soft, nontender.  Positive, normoactive bowel sounds. No organomegaly palpated. MSK:  No focal spinal tenderness to palpation. EXTREMITIES:  No peripheral edema.  Compression hose in place.  SKIN:  Clear with no obvious rashes or skin changes.  NEURO:  Nonfocal. Well oriented.  Appropriate affect.    LAB RESULTS:  CMP     Component Value Date/Time   NA 141 03/23/2019 1423   K 4.4 03/23/2019 1423   CL 109 03/23/2019 1423   CO2 24 03/23/2019 1423   GLUCOSE 101 (H) 03/23/2019 1423   BUN 19 03/23/2019 1423   CREATININE 0.87 03/23/2019 1423   CALCIUM 8.8 (L) 03/23/2019 1423   PROT 8.4 (H) 03/23/2019 1423   ALBUMIN 3.5 03/23/2019 1423   AST 22 03/23/2019 1423   ALT 23 03/23/2019 1423   ALKPHOS 131 (H) 03/23/2019  1423   BILITOT 0.3 03/23/2019 1423   GFRNONAA >60 03/23/2019 1423   GFRAA >60 03/23/2019 1423    Lab Results  Component Value Date   TOTALPROTELP 7.8 03/23/2019   ALBUMINELP 3.5 03/23/2019   A1GS 0.2 03/23/2019   A2GS 0.8 03/23/2019   BETS 1.2 03/23/2019   GAMS 2.1 (H) 03/23/2019   MSPIKE Not Observed 03/23/2019    No results found for: KPAFRELGTCHN, LAMBDASER, [facility]  Lab Results  Component Value Date   WBC 7.5 09/02/2019   NEUTROABS 4.2 09/02/2019   HGB 13.1 09/02/2019   HCT 41.7 09/02/2019   MCV 84.9 09/02/2019   PLT 263 09/02/2019      Chemistry      Component Value Date/Time   NA 141 03/23/2019 1423   K 4.4 03/23/2019 1423   CL 109 03/23/2019 1423   CO2 24 03/23/2019 1423   BUN  19 03/23/2019 1423   CREATININE 0.87 03/23/2019 1423      Component Value Date/Time   CALCIUM 8.8 (L) 03/23/2019 1423   ALKPHOS 131 (H) 03/23/2019 1423   AST 22 03/23/2019 1423   ALT 23 03/23/2019 1423   BILITOT 0.3 03/23/2019 1423       No results found for: LABCA2  No components found for: [id number]  No results for input(s): INR in the last 168 hours.  No results found for: LABCA2  No results found for: YIF027  No results found for: XAJ287  No results found for: OMV672  No results found for: CA2729  No components found for: HGQUANT  No results found for: CEA1 / No results found for: CEA1   No results found for: AFPTUMOR  No results found for: [place]  No results found for: PSA1  Appointment on 09/02/2019  Component Date Value Ref Range Status   Retic Ct Pct 09/02/2019 1.7  0.4 - 3.1 % Final   RBC. 09/02/2019 4.86  4.22 - 5.81 MIL/uL Final   Retic Count, Absolute 09/02/2019 84.1  19.0 - 186.0 K/uL Final   Immature Retic Fract 09/02/2019 14.1  2.3 - 15.9 % Final   Performed at [facility], [place] [address], [place], [place] [address]   WBC Count 09/02/2019 7.5  4.0 - 10.5 K/uL Final   RBC 09/02/2019 4.91  4.22 - 5.81  MIL/uL Final   Hemoglobin 09/02/2019 13.1  13.0 - 17.0 g/dL Final   HCT 09/02/2019 41.7  39.0 - 52.0 % Final   MCV 09/02/2019 84.9  80.0 - 100.0 fL Final   MCH 09/02/2019 26.7  26.0 - 34.0 pg Final   MCHC 09/02/2019 31.4  30.0 - 36.0 g/dL Final   RDW 09/02/2019 15.1  11.5 - 15.5 % Final   Platelet Count 09/02/2019 263  150 - 400 K/uL Final   nRBC 09/02/2019 0.0  0.0 - 0.2 % Final   Neutrophils Relative % 09/02/2019 56  % Final   Neutro Abs 09/02/2019 4.2  1.7 - 7.7 K/uL Final   Lymphocytes Relative 09/02/2019 31  % Final   Lymphs Abs 09/02/2019 2.3  0.7 - 4.0 K/uL Final   Monocytes Relative 09/02/2019 8  % Final   Monocytes Absolute 09/02/2019 0.6  0.1 - 1.0 K/uL Final   Eosinophils Relative 09/02/2019 4  % Final   Eosinophils Absolute 09/02/2019 0.3  0.0 - 0.5 K/uL Final   Basophils Relative 09/02/2019 1  % Final   Basophils Absolute 09/02/2019 0.1  0.0 - 0.1 K/uL Final   Immature Granulocytes 09/02/2019 0  % Final   Abs Immature Granulocytes 09/02/2019 0.03  0.00 - 0.07 K/uL Final   Performed at [facility], [place] [address] [place], [place] [address]    (this displays the last labs from the last 3 days)  Lab Results  Component Value Date   TOTALPROTELP 7.8 03/23/2019   ALBUMINELP 3.5 03/23/2019   A1GS 0.2 03/23/2019   A2GS 0.8 03/23/2019   BETS 1.2 03/23/2019   GAMS 2.1 (H) 03/23/2019   MSPIKE Not Observed 03/23/2019   (this displays SPEP labs)  No results found for: KPAFRELGTCHN, LAMBDASER, KAPLAMBRATIO (kappa/lambda light chains)  No results found for: HGBA, HGBA2QUANT, HGBFQUANT, HGBSQUAN (Hemoglobinopathy evaluation)   Lab Results  Component Value Date   LDH 167 03/23/2019    Lab Results  Component Value Date   IRON 55 03/23/2019   TIBC 290 03/23/2019   IRONPCTSAT 19 (L)  03/23/2019   (Iron and TIBC)  Lab Results  Component Value Date   FERRITIN 86 03/23/2019    Urinalysis    Component Value Date/Time    COLORURINE YELLOW 11/06/2017 2049   APPEARANCEUR CLEAR 11/06/2017 2049   LABSPEC 1.018 11/06/2017 2049   PHURINE 7.0 11/06/2017 2049   [place] 11/06/2017 2049   HGBUR NEGATIVE 11/06/2017 2049   [place] NEGATIVE 11/06/2017 2049   [place] NEGATIVE 11/06/2017 2049   PROTEINUR NEGATIVE 11/06/2017 2049   NITRITE NEGATIVE 11/06/2017 2049   LEUKOCYTESUR NEGATIVE 11/06/2017 2049     STUDIES:   ASSESSMENT: 53 y.o. male with chronic venous insufficiency, CAD, and seizure disorder with DVT in 2018  1. Acute DVT diagnosed in 10/2017 in the distal right external iliac vein, common femoral, and popliteal vein.    (a) s/p lower extremity angiography and peripheral vascular balloon angioplasty at that time with vascular surgery.    2. Coumadin started in 10/2017 undercover of heparin  (a) Coumadin stopped in 04/2019.  3.  Evaluated by hematology (Dr. Walden Field)  (a) hypercoagulable panel in 02/2019 do not identify cause  (b) CT C/A/P in 04/2019 are negative    (c) d-dimer still elevated 09/02/2019 at 2.29 (no prior determination).   PLAN: Jamarques met with Dr. Jana Hakim.  Dr. Jana Hakim reviewed his risks, including his obesity, his history of heart disease in himself and his family.  Since he has done well off of the Coumadin, he was recommended to stay off of the coumadin, and to take a baby aspirin daily.  He was recommended to lose weight and exercise as well.  [place] does not need a scheduled f/u with [place] at this time.  We are happy to see him if the need ever does arise in the future.    he was recommended to continue with the appropriate pandemic precautions. he knows to call for any questions that may arise.   Wilber Bihari, NP   09/02/2019 11:23 AM Medical Oncology and Hematology [facility] [address] [place], [place] [address] Tel. [phone]    Fax. [phone]   ADDENDUM: Mr. Lindroth had a right lower extremity deep vein thrombosis documented by  Doppler ultrasonography November 2018.  As far as I can tell this was unprovoked.  He had thrombectomy and venoplasty at the time and after a heparin bridge was started on warfarin which was discontinued 18 months later (in May 2020).  The patient was evaluated here by Dr. Walden Field with a hypercoagulable panel which was negative.  He and I discussed his risk of further clots.  In addition to his having had what appears to have been an unprovoked clot and a continuing elevation in his d-dimer, he is overweight and relatively sedentary.  Balanced against the risk of future clots is the risk of bleeding from lifelong anticoagulation.  After much discussion we decided that we would not proceed with lifelong anticoagulation.  He will take a baby aspirin daily.  This of course primarily works with arterial clots but it also does have some slight reduction in the risk of venous clots.  It is in any case indicated because of his cardiovascular risk factors which include male sex, a positive family history, and obesity.  We discussed risk modification and he is very motivated regarding diet and exercise  At this point I am comfortable releasing him to his primary care physicians.  I will be glad to see Mr. Cortez again at any point in the future if  and when the need arises.   I personally saw this patient and performed a substantive portion of this encounter with the listed APP documented above.   Chauncey Cruel, MD Medical Oncology and Hematology [facility] [address] [place],  [address] Tel. [phone]    Fax. [phone]

## 2019-10-12 ENCOUNTER — Other Ambulatory Visit: Payer: Self-pay

## 2019-10-12 DIAGNOSIS — Z20822 Contact with and (suspected) exposure to covid-19: Secondary | ICD-10-CM

## 2019-10-13 LAB — NOVEL CORONAVIRUS, NAA: SARS-CoV-2, NAA: NOT DETECTED

## 2019-12-16 DIAGNOSIS — I83813 Varicose veins of bilateral lower extremities with pain: Secondary | ICD-10-CM

## 2020-09-12 DIAGNOSIS — I8393 Asymptomatic varicose veins of bilateral lower extremities: Secondary | ICD-10-CM

## 2020-10-06 ENCOUNTER — Telehealth: Payer: Self-pay

## 2020-10-06 NOTE — Telephone Encounter (Signed)
Pt called to schedule an appt after experiencing a bleeding vein this morning after his shower on L foot area. He called EMS and his son held pressure and was able to get the bleeding to stop by the time EMS arrived. Per MD pt can see APP with venous reflux study ordered. Pt is aware and verbalized understanding. We have ordered him 2 pairs of medium stockings and will call him when they arrive.

## 2020-10-07 ENCOUNTER — Other Ambulatory Visit: Payer: Self-pay

## 2020-10-07 DIAGNOSIS — I83022 Varicose veins of left lower extremity with ulcer of calf: Secondary | ICD-10-CM

## 2020-10-11 ENCOUNTER — Other Ambulatory Visit: Payer: Self-pay

## 2020-10-11 ENCOUNTER — Ambulatory Visit: Payer: BC Managed Care – PPO | Admitting: Physician Assistant

## 2020-10-11 ENCOUNTER — Ambulatory Visit (HOSPITAL_COMMUNITY)
Admission: RE | Admit: 2020-10-11 | Discharge: 2020-10-11 | Disposition: A | Discharge status: Home / Self Care | Payer: BC Managed Care – PPO | Source: Ambulatory Visit | Attending: Vascular Surgery | Admitting: Vascular Surgery

## 2020-10-11 VITALS — BP 116/75 | HR 62 | Temp 98.2°F | Resp 20 | Ht 74.0 in | Wt 243.4 lb

## 2020-10-11 DIAGNOSIS — I83899 Varicose veins of unspecified lower extremities with other complications: Secondary | ICD-10-CM | POA: Diagnosis not present

## 2020-10-11 DIAGNOSIS — L97221 Non-pressure chronic ulcer of left calf limited to breakdown of skin: Secondary | ICD-10-CM | POA: Insufficient documentation

## 2020-10-11 DIAGNOSIS — I83022 Varicose veins of left lower extremity with ulcer of calf: Secondary | ICD-10-CM | POA: Insufficient documentation

## 2020-10-11 DIAGNOSIS — I8393 Asymptomatic varicose veins of bilateral lower extremities: Secondary | ICD-10-CM

## 2020-10-11 DIAGNOSIS — I872 Venous insufficiency (chronic) (peripheral): Secondary | ICD-10-CM | POA: Diagnosis not present

## 2020-10-11 NOTE — Progress Notes (Signed)
VASCULAR & VEIN SPECIALISTS           OF [place]  History and Physical   Patrick Ingram is a 54 y.o. male who presents with history of bilateral leg swelling.  He was last seen by Dr. Imogene Burn in 2019 and had hx of right leg venogram with IVUS of right iliofemoral vein, angiojet of right iliofemoral, PTA, CFV, FV, and PV as well as lytic catheter placement in November 2018.  He was seen back in July 2019 and his sx had not progressed. He was on coumadin.  He was seen by hematology in May 2020 and it was felt that he could come off his coumadin and to notify them of any SOB or LE leg swelling.  Pt has hx of vein stripping in the past.  He states that he was in the shower and had a vein on the top of his left foot bleed.  His son called 911.  He laid him down and applied pressure to the area.  This did stop the bleeding.  It has not happened since.   The pt is compliant with his compression.  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    The pt is not on a statin for cholesterol management.  The pt is not on a daily aspirin.   Other AC:  none The pt is not on medication for hypertension.   The pt is not diabetic.   Tobacco hx:  Never  No family hx of AAA to his knowledge.    Past Medical History:  Diagnosis Date  . Ankle ulcer ([facility])    "I've had them on both sides" (11/06/2017)  . DVT (deep venous thrombosis) ([facility]) 10/30/2017   RLE  . Grand mal seizure [facility])    LAST SEIZURE 12/25/1991; on daily RX since" (11/06/2017)  . Varicose veins     Past Surgical History:  Procedure Laterality Date  . CLOSED REDUCTION FINGER WITH PERCUTANEOUS PINNING Left 08/27/2017   Procedure: CLOSED REDUCTION WITH PERCUTANEOUS PINNING LEFT SMALL FINGER;  Surgeon: Betha Loa, MD;  Location: [facility];  Service: Orthopedics;  Laterality: Left;  . ENDOVENOUS ABLATION SAPHENOUS VEIN W/ LASER Left 06-25-2013   left anterior  saphenous vein and stab phlebectomy 10-20 incisions left leg  by Gretta Began MD  . ENDOVENOUS ABLATION SAPHENOUS VEIN W/ LASER Right 09/2013  . FRACTURE SURGERY    . LOWER EXTREMITY ANGIOGRAPHY N/A 11/08/2017   Procedure: LOWER EXTREMITY ANGIOGRAPHY - Lysis Recheck;  Surgeon: Fransisco Hertz, MD;  Location: [facility] INVAS[facility] LAB;  Service: Cardiovascular;  Laterality: N/A;  . LOWER EXTREMITY VENOGRAPHY Right 11/07/2017   Procedure: LOWER EXTREMITY VENOGRAPHYwith thrombolysis;  Surgeon: Fransisco Hertz, MD;  Location: [facility] INVASIVE CV LAB;  Service: Cardiovascular;  Laterality: Right;  . PERIPHERAL VASCULAR BALLOON ANGIOPLASTY Right 11/08/2017   Procedure: PERIPHERAL VASCULAR BALLOON ANGIOPLASTY;  Surgeon: Fransisco Hertz, MD;  Location: [facility] INVASIVE CV LAB;  Service: Cardiovascular;  Laterality: Right;  LE venous system    Social History   Socioeconomic History  . Marital status: Married    Spouse name: Not on file  . Number of children: Not on file  . Years of education: Not on file  . Highest education level: Not on file  Occupational History  . Not on file  Tobacco Use  . Smoking status: Never  Smoker  . Smokeless tobacco: Never Used  Vaping Use  . Vaping Use: Never used  Substance and Sexual Activity  . Alcohol use: No  . Drug use: No  . Sexual activity: Not Currently  Other Topics Concern  . Not on file  Social History Narrative  . Not on file   Social Determinants of Health   Financial Resource Strain:   . Difficulty of Paying Living Expenses: Not on file  Food Insecurity:   . Worried About Programme researcher, broadcasting/film/video in the Last Year: Not on file  . Ran Out of Food in the Last Year: Not on file  Transportation Needs:   . Lack of Transportation (Medical): Not on file  . Lack of Transportation (Non-Medical): Not on file  Physical Activity:   . Days of Exercise per Week: Not on file  . Minutes of Exercise per Session: Not on file  Stress:   . Feeling of Stress : Not on file  Social  Connections:   . Frequency of Communication with Friends and Family: Not on file  . Frequency of Social Gatherings with Friends and Family: Not on file  . Attends Religious Services: Not on file  . Active Member of Clubs or Organizations: Not on file  . Attends Banker Meetings: Not on file  . Marital Status: Not on file  Intimate Partner Violence:   . Fear of Current or Ex-Partner: Not on file  . Emotionally Abused: Not on file  . Physically Abused: Not on file  . Sexually Abused: Not on file     Family History  Problem Relation Age of Onset  . Heart disease Father   . Diabetes Paternal Grandmother     Current Outpatient Medications  Medication Sig Dispense Refill  . phenytoin (DILANTIN) 100 MG ER capsule Take 400 mg daily by mouth.      No current facility-administered medications for this visit.    No Known Allergies  REVIEW OF SYSTEMS:   [X]  denotes positive finding, [ ]  denotes negative finding Cardiac  Comments:  Chest pain or chest pressure:    Shortness of breath upon exertion:    Short of breath when lying flat:    Irregular heart rhythm:        Vascular    Pain in calf, thigh, or hip brought on by ambulation:    Pain in feet at night that wakes you up from your sleep:     Blood clot in your veins:    Leg swelling:  x       Pulmonary    Oxygen at home:    Productive cough:     Wheezing:         Neurologic    Sudden weakness in arms or legs:     Sudden numbness in arms or legs:     Sudden onset of difficulty speaking or slurred speech:    Temporary loss of vision in one eye:     Problems with dizziness:         Gastrointestinal    Blood in stool:     Vomited blood:         Genitourinary    Burning when urinating:     Blood in urine:        Psychiatric    Major depression:         Hematologic    Bleeding problems: x See HPI  Problems with blood clotting too easily:  Skin    Rashes or ulcers:        Constitutional      Fever or chills:      PHYSICAL EXAMINATION:  Today's Vitals   10/11/20 0943  BP: 116/75  Pulse: 62  Resp: 20  Temp: 98.2 F (36.8 C)  TempSrc: Temporal  SpO2: 99%  Weight: 243 lb 6.4 oz (110.4 kg)  Height: 6\' 2"  (1.88 m)  PainSc: 1   PainLoc: Leg   Body mass index is 31.25 kg/m.   General:  WDWN in NAD; vital signs documented above Gait: Not observed HENT: WNL, normocephalic Pulmonary: normal non-labored breathing without wheezing Cardiac: regular HR; without carotid bruits Abdomen: soft, NT, no masses; aortic pulse is not palpable Skin: without rashes Vascular Exam/Pulses:  Right Left  Radial 2+ (normal) 2+ (normal)  DP 2+ (normal) 2+ (normal)  PT Unable to palpate Unable to palpate;   Extremities: hemosiderin changes to LLE    Musculoskeletal: no muscle wasting or atrophy  Neurologic: A&O X 3;  moving all extremities equally Psychiatric:  The pt has Normal affect.   Non-Invasive Vascular Imaging:   Venous duplex on 10/11/2020: +--------------+---------+------+-----------+------------+--------+  LEFT     Reflux NoRefluxReflux TimeDiameter cmsComments               Yes                   +--------------+---------+------+-----------+------------+--------+  CFV            yes                   +--------------+---------+------+-----------+------------+--------+  FV mid          yes                   +--------------+---------+------+-----------+------------+--------+  Popliteal         yes                   +--------------+---------+------+-----------+------------+--------+  [facility]        yes         0.46        +--------------+---------+------+-----------+------------+--------+  GSV prox thigh      yes         0.58         +--------------+---------+------+-----------+------------+--------+  GSV mid thigh       yes         0.36        +--------------+---------+------+-----------+------------+--------+  GSV dist thigh      yes         0.18        +--------------+---------+------+-----------+------------+--------+  GSV at knee        yes         0.29        +--------------+---------+------+-----------+------------+--------+  GSV prox calf       yes         0.29        +--------------+---------+------+-----------+------------+--------+  SSV Pop Fossa no               0.32        +--------------+---------+------+-----------+------------+--------+  SSV prox calf no               0.32        +--------------+---------+------+-----------+------------+--------+  SSV mid calf no               0.24        +--------------+---------+------+-----------+------------+--------+    Summary:  Left:  - No  evidence of deep vein thrombosis seen in the left lower extremity,  from the common femoral through the popliteal veins.  - No evidence of superficial venous thrombosis in the left lower  extremity.  - Venous reflux is noted in the left common femoral vein.  - Venous reflux is noted in the left sapheno-femoral junction.  - Venous reflux is noted in the left greater saphenous vein in the thigh.  - Venous reflux is noted in the left greater saphenous vein in the calf.  - Venous reflux is noted in the left femoral vein.  - Venous reflux is noted in the left popliteal vein.    Patrick Ingram is a 54 y.o. male who presents with: bleeding varicosity of the left foot.    -he does have venous insufficiency in the left leg.  There is no DVT.  The vein is not adequate size for laser ablation.  Pt with palpable DP pulses bilaterally.  -he will continue  wearing his 20-70mmHg thigh high compression socks and leg elevation.  He is very compliant with his compression.   I gave him a handout on venous insufficiency today.  -discussed importance of weight loss-he states that he has been eating more healthy and has cut out most soft drinks and is exercising more.  His weight is down significantly when he weighed today.   -will d/w vein RN to see if he is a candidate for sclerotherapy for the bleeding vein.  We will contact him to let him know.   Cell:  [phone] (he does not have voice mail set up for this) Home:  [phone] (okay to leave a message)   Doreatha Massed, [facility] Vascular and Vein Specialists 10/11/2020 9:15 AM  Clinic MD:  Chestine Spore

## 2020-10-20 ENCOUNTER — Telehealth: Payer: Self-pay

## 2020-10-20 NOTE — Telephone Encounter (Signed)
Called pt to schedule him to see MD for his recent bleeding vein to determine if sclero is an option for the vein. Pt verbalized understanding and has no further questions/concerns at this time.

## 2020-10-26 ENCOUNTER — Encounter: Payer: Self-pay | Admitting: Vascular Surgery

## 2020-10-26 ENCOUNTER — Other Ambulatory Visit: Payer: Self-pay

## 2020-10-26 ENCOUNTER — Ambulatory Visit: Payer: BC Managed Care – PPO | Admitting: Vascular Surgery

## 2020-10-26 VITALS — BP 117/74 | HR 76 | Temp 97.2°F | Resp 18 | Ht 73.75 in | Wt 244.8 lb

## 2020-10-26 DIAGNOSIS — I83812 Varicose veins of left lower extremities with pain: Secondary | ICD-10-CM | POA: Diagnosis not present

## 2020-10-26 NOTE — Progress Notes (Signed)
Patient is a 54 year old male who returns for follow-up today.  Recently had bleeding from the dorsum of his left foot after wearing some shoes that may have rubbed an area and ulcerated and caused bleeding.  It was the usual bleeding from varicose vein with high pressure.  This was stopped with elevation and direct pressure.  He has not had a bleeding episode since then.  He has not really noticed a varicosity in the area of bleeding.  He was seen in our APP clinic on October 19.  He had previously been seen in 2018 for a right iliofemoral DVT.  He had laser ablation of both greater saphenous veins in 2008 by report.  He had laser ablation of the left anterior saphenous vein and stab phlebectomies by Dr. Arbie Cookey in 2014.  He apparently had a laser ablation also done of the right greater saphenous vein in 2014.  He recently had bleeding from the varicose vein on the top of his left foot.  He does wear compression stockings.  He has had skin breakdown in the past with ulcerations on his legs in 2009 and 2014.  These were treated with Unna boots.  Past Medical History:  Diagnosis Date   Ankle ulcer (HCC)    "I've had them on both sides" (11/06/2017)   DVT (deep venous thrombosis) (HCC) 10/30/2017   RLE   Grand mal seizure (HCC)    LAST SEIZURE 12/25/1991; on daily RX since" (11/06/2017)   Varicose veins     Past Surgical History:  Procedure Laterality Date   CLOSED REDUCTION FINGER WITH PERCUTANEOUS PINNING Left 08/27/2017   Procedure: CLOSED REDUCTION WITH PERCUTANEOUS PINNING LEFT SMALL FINGER;  Surgeon: Betha Loa, MD;  Location: [facility];  Service: Orthopedics;  Laterality: Left;   ENDOVENOUS ABLATION SAPHENOUS VEIN W/ LASER Left 06-25-2013   left anterior saphenous vein and stab phlebectomy 10-20 incisions left leg  by Gretta Began MD   ENDOVENOUS ABLATION SAPHENOUS VEIN W/ LASER Right 09/2013   FRACTURE SURGERY     LOWER EXTREMITY ANGIOGRAPHY N/A 11/08/2017   Procedure:  LOWER EXTREMITY ANGIOGRAPHY - Lysis Recheck;  Surgeon: Fransisco Hertz, MD;  Location: [facility];  Service: Cardiovascular;  Laterality: N/A;   LOWER EXTREMITY VENOGRAPHY Right 11/07/2017   Procedure: LOWER EXTREMITY VENOGRAPHYwith thrombolysis;  Surgeon: Fransisco Hertz, MD;  Location: [facility];  Service: Cardiovascular;  Laterality: Right;   PERIPHERAL VASCULAR BALLOON ANGIOPLASTY Right 11/08/2017   Procedure: PERIPHERAL VASCULAR BALLOON ANGIOPLASTY;  Surgeon: Fransisco Hertz, MD;  Location: [facility] INVASIVE CV LAB;  Service: Cardiovascular;  Laterality: Right;  LE venous system   Physical exam:  Vitals:   10/26/20 0908  BP: 117/74  Pulse: 76  Resp: 18  Temp: (!) 97.2 F (36.2 C)  TempSrc: Temporal  SpO2: 100%  Weight: 244 lb 12.8 oz (111 kg)  Height: 6' 1.75" (1.873 m)   Extremities: Diffusely dry scaly skin need to foot left leg, hemosiderin staining left gaiter area, small area of dry eschar 1 x 1 cm dorsum left foot at the base of the toes no obvious varicosity underneath this, 2+ left dorsalis pedis pulse  Data: Patient had a duplex ultrasound on October 11, 2020 which showed diffuse reflux in the left common femoral vein saphenofemoral junction and entire left greater saphenous vein femoral vein and popliteal vein.  Vein diameter was 4 to 6 mm and a short segment of the proximal thigh but fairly small from the mid thigh  down.  Lesser saphenous was 3 mm in diameter but no reflux.  Assessment: Recent bleeding episode dorsum left foot most likely secondary to venous hypertension.  Patient has had some recanalization of his left greater saphenous vein but overall vein diameter is fairly small.  I believe his bleeding episode was more related to venous hypertension and an ulceration of the skin then recanalization of the saphenous vein.  Patient was counseled today on how to protect his feet add moisturizing lotion elevate and pressure if he has recurrent bleeding.  I do not see  anything in this area today that would warrant sclerotherapy as there is no obvious visible vein.  Plan: Patient will continue to wear his compression stockings.  He will add moisturizing lotion to his legs to prevent skin cracking.  He will follow up with [place] on an as-needed basis.  Fabienne Bruns, MD [facility] Office: [phone]

## 2021-06-20 DIAGNOSIS — M7989 Other specified soft tissue disorders: Secondary | ICD-10-CM

## 2022-07-16 DIAGNOSIS — Z125 Encounter for screening for malignant neoplasm of prostate: Secondary | ICD-10-CM | POA: Diagnosis not present

## 2022-07-16 DIAGNOSIS — G40909 Epilepsy, unspecified, not intractable, without status epilepticus: Secondary | ICD-10-CM | POA: Diagnosis not present

## 2022-07-16 DIAGNOSIS — N529 Male erectile dysfunction, unspecified: Secondary | ICD-10-CM | POA: Diagnosis not present

## 2022-07-16 DIAGNOSIS — Z0000 Encounter for general adult medical examination without abnormal findings: Secondary | ICD-10-CM | POA: Diagnosis not present

## 2022-11-06 DIAGNOSIS — K644 Residual hemorrhoidal skin tags: Secondary | ICD-10-CM | POA: Diagnosis not present

## 2022-11-06 DIAGNOSIS — Z86010 Personal history of colonic polyps: Secondary | ICD-10-CM | POA: Diagnosis not present

## 2022-11-06 DIAGNOSIS — K648 Other hemorrhoids: Secondary | ICD-10-CM | POA: Diagnosis not present

## 2022-11-06 DIAGNOSIS — Q438 Other specified congenital malformations of intestine: Secondary | ICD-10-CM | POA: Diagnosis not present

## 2022-11-06 DIAGNOSIS — Z09 Encounter for follow-up examination after completed treatment for conditions other than malignant neoplasm: Secondary | ICD-10-CM | POA: Diagnosis not present

## 2022-11-08 DIAGNOSIS — G40309 Generalized idiopathic epilepsy and epileptic syndromes, not intractable, without status epilepticus: Secondary | ICD-10-CM | POA: Diagnosis not present

## 2023-02-11 DIAGNOSIS — R1031 Right lower quadrant pain: Secondary | ICD-10-CM | POA: Diagnosis not present

## 2023-04-23 DIAGNOSIS — M25562 Pain in left knee: Secondary | ICD-10-CM | POA: Diagnosis not present

## 2023-07-19 DIAGNOSIS — Z0000 Encounter for general adult medical examination without abnormal findings: Secondary | ICD-10-CM | POA: Diagnosis not present

## 2023-07-19 DIAGNOSIS — G40909 Epilepsy, unspecified, not intractable, without status epilepticus: Secondary | ICD-10-CM | POA: Diagnosis not present

## 2023-07-19 DIAGNOSIS — Z125 Encounter for screening for malignant neoplasm of prostate: Secondary | ICD-10-CM | POA: Diagnosis not present

## 2023-07-19 DIAGNOSIS — N529 Male erectile dysfunction, unspecified: Secondary | ICD-10-CM | POA: Diagnosis not present

## 2023-07-19 DIAGNOSIS — D6859 Other primary thrombophilia: Secondary | ICD-10-CM | POA: Diagnosis not present

## 2023-07-19 DIAGNOSIS — Z13220 Encounter for screening for lipoid disorders: Secondary | ICD-10-CM | POA: Diagnosis not present

## 2023-11-04 DIAGNOSIS — G40309 Generalized idiopathic epilepsy and epileptic syndromes, not intractable, without status epilepticus: Secondary | ICD-10-CM | POA: Diagnosis not present

## 2024-02-21 DIAGNOSIS — Z03818 Encounter for observation for suspected exposure to other biological agents ruled out: Secondary | ICD-10-CM | POA: Diagnosis not present

## 2024-02-21 DIAGNOSIS — R051 Acute cough: Secondary | ICD-10-CM | POA: Diagnosis not present

## 2024-08-05 DIAGNOSIS — Z0000 Encounter for general adult medical examination without abnormal findings: Secondary | ICD-10-CM | POA: Diagnosis not present

## 2024-08-05 DIAGNOSIS — Z23 Encounter for immunization: Secondary | ICD-10-CM | POA: Diagnosis not present

## 2024-08-06 DIAGNOSIS — N529 Male erectile dysfunction, unspecified: Secondary | ICD-10-CM | POA: Diagnosis not present

## 2024-08-06 DIAGNOSIS — E669 Obesity, unspecified: Secondary | ICD-10-CM | POA: Diagnosis not present

## 2024-08-06 DIAGNOSIS — G40909 Epilepsy, unspecified, not intractable, without status epilepticus: Secondary | ICD-10-CM | POA: Diagnosis not present
